# Patient Record
Sex: Female | Born: 1955 | Race: White | Hispanic: No | Marital: Married | Smoking: Former smoker
Health system: Southern US, Community
[De-identification: ages and names within clinical notes are randomized; demographics above are authoritative.]

## PROBLEM LIST (undated history)

## (undated) DIAGNOSIS — M810 Age-related osteoporosis without current pathological fracture: Secondary | ICD-10-CM

## (undated) DIAGNOSIS — I4729 Other ventricular tachycardia: Secondary | ICD-10-CM

## (undated) DIAGNOSIS — F329 Major depressive disorder, single episode, unspecified: Secondary | ICD-10-CM

## (undated) DIAGNOSIS — M1990 Unspecified osteoarthritis, unspecified site: Secondary | ICD-10-CM

## (undated) DIAGNOSIS — F419 Anxiety disorder, unspecified: Secondary | ICD-10-CM

## (undated) DIAGNOSIS — I472 Ventricular tachycardia: Secondary | ICD-10-CM

## (undated) DIAGNOSIS — F32A Depression, unspecified: Secondary | ICD-10-CM

## (undated) HISTORY — PX: TUBAL LIGATION: SHX77

## (undated) HISTORY — DX: Other ventricular tachycardia: I47.29

## (undated) HISTORY — PX: LEG SURGERY: SHX1003

## (undated) HISTORY — PX: WISDOM TOOTH EXTRACTION: SHX21

## (undated) HISTORY — DX: Ventricular tachycardia: I47.2

## (undated) HISTORY — PX: OTHER SURGICAL HISTORY: SHX169

## (undated) HISTORY — PX: ABDOMINAL HYSTERECTOMY: SHX81

---

## 1997-11-08 ENCOUNTER — Encounter: Admission: RE | Admit: 1997-11-08 | Discharge: 1997-11-08 | Payer: Self-pay | Admitting: Family Medicine

## 1997-11-08 ENCOUNTER — Other Ambulatory Visit: Admission: RE | Admit: 1997-11-08 | Discharge: 1997-11-08 | Payer: Self-pay | Admitting: *Deleted

## 1999-04-10 ENCOUNTER — Encounter: Admission: RE | Admit: 1999-04-10 | Discharge: 1999-04-10 | Payer: Self-pay | Admitting: Family Medicine

## 2003-01-17 ENCOUNTER — Ambulatory Visit (HOSPITAL_COMMUNITY): Admission: RE | Admit: 2003-01-17 | Discharge: 2003-01-17 | Payer: Self-pay | Admitting: Family Medicine

## 2003-01-27 ENCOUNTER — Encounter: Admission: RE | Admit: 2003-01-27 | Discharge: 2003-01-27 | Payer: Self-pay | Admitting: Family Medicine

## 2008-03-05 ENCOUNTER — Inpatient Hospital Stay (HOSPITAL_COMMUNITY): Admission: AD | Admit: 2008-03-05 | Discharge: 2008-03-05 | Payer: Self-pay | Admitting: Family Medicine

## 2009-05-22 ENCOUNTER — Inpatient Hospital Stay (HOSPITAL_COMMUNITY): Admission: EM | Admit: 2009-05-22 | Discharge: 2009-05-26 | Payer: Self-pay | Admitting: Emergency Medicine

## 2009-06-05 ENCOUNTER — Encounter: Admission: RE | Admit: 2009-06-05 | Discharge: 2009-09-12 | Payer: Self-pay | Admitting: Orthopedic Surgery

## 2010-03-23 ENCOUNTER — Encounter: Payer: Self-pay | Admitting: Family Medicine

## 2010-05-27 LAB — BASIC METABOLIC PANEL
BUN: 7 mg/dL (ref 6–23)
BUN: 8 mg/dL (ref 6–23)
CO2: 27 mEq/L (ref 19–32)
Calcium: 8 mg/dL — ABNORMAL LOW (ref 8.4–10.5)
Calcium: 8.2 mg/dL — ABNORMAL LOW (ref 8.4–10.5)
Calcium: 8.8 mg/dL (ref 8.4–10.5)
Chloride: 105 mEq/L (ref 96–112)
Chloride: 108 mEq/L (ref 96–112)
Creatinine, Ser: 0.69 mg/dL (ref 0.4–1.2)
Creatinine, Ser: 0.72 mg/dL (ref 0.4–1.2)
GFR calc Af Amer: >60 mL/min (ref >60)
GFR calc Af Amer: >60 mL/min (ref >60)
GFR calc Af Amer: >60 mL/min (ref >60)
GFR calc non Af Amer: >60 mL/min (ref >60)
GFR calc non Af Amer: >60 mL/min (ref >60)
GFR calc non Af Amer: >60 mL/min (ref >60)
Glucose, Bld: 127 mg/dL — ABNORMAL HIGH (ref 70–99)
Glucose, Bld: 85 mg/dL (ref 70–99)
Potassium: 3.4 mEq/L — ABNORMAL LOW (ref 3.5–5.1)
Potassium: 3.5 mEq/L (ref 3.5–5.1)
Potassium: 3.6 mEq/L (ref 3.5–5.1)
Sodium: 135 mEq/L (ref 135–145)
Sodium: 136 mEq/L (ref 135–145)
Sodium: 138 mEq/L (ref 135–145)
Sodium: 141 mEq/L (ref 135–145)

## 2010-05-27 LAB — COMPREHENSIVE METABOLIC PANEL
ALT: 15 U/L (ref 0–35)
AST: 21 U/L (ref 0–37)
Albumin: 3.2 g/dL — ABNORMAL LOW (ref 3.5–5.2)
Alkaline Phosphatase: 84 U/L (ref 39–117)
BUN: 6 mg/dL (ref 6–23)
CO2: 25 mEq/L (ref 19–32)
Calcium: 8.7 mg/dL (ref 8.4–10.5)
Chloride: 109 mEq/L (ref 96–112)
Creatinine, Ser: 0.79 mg/dL (ref 0.4–1.2)
GFR calc Af Amer: >60 mL/min (ref >60)
GFR calc non Af Amer: >60 mL/min (ref >60)
Glucose, Bld: 112 mg/dL — ABNORMAL HIGH (ref 70–99)
Potassium: 3.8 mEq/L (ref 3.5–5.1)
Sodium: 140 mEq/L (ref 135–145)
Total Bilirubin: 0.6 mg/dL (ref 0.3–1.2)
Total Protein: 6.4 g/dL (ref 6.0–8.3)

## 2010-05-27 LAB — DIFFERENTIAL
Basophils Absolute: 0 10*3/uL (ref 0.0–0.1)
Basophils Absolute: 0 10*3/uL (ref 0.0–0.1)
Basophils Relative: 0 % (ref 0–1)
Basophils Relative: 0 % (ref 0–1)
Eosinophils Absolute: 0 10*3/uL (ref 0.0–0.7)
Eosinophils Absolute: 0.1 10*3/uL (ref 0.0–0.7)
Eosinophils Relative: 0 % (ref 0–5)
Eosinophils Relative: 1 % (ref 0–5)
Lymphocytes Relative: 13 % (ref 12–46)
Lymphocytes Relative: 13 % (ref 12–46)
Lymphs Abs: 1 10*3/uL (ref 0.7–4.0)
Lymphs Abs: 1.1 10*3/uL (ref 0.7–4.0)
Monocytes Absolute: 0.3 10*3/uL (ref 0.1–1.0)
Monocytes Absolute: 0.7 10*3/uL (ref 0.1–1.0)
Monocytes Relative: 3 % (ref 3–12)
Monocytes Relative: 8 % (ref 3–12)
Neutro Abs: 6.6 10*3/uL (ref 1.7–7.7)
Neutro Abs: 6.7 10*3/uL (ref 1.7–7.7)
Neutrophils Relative %: 79 % — ABNORMAL HIGH (ref 43–77)
Neutrophils Relative %: 83 % — ABNORMAL HIGH (ref 43–77)

## 2010-05-27 LAB — CBC
HCT: 28.4 % — ABNORMAL LOW (ref 36.0–46.0)
HCT: 33.8 % — ABNORMAL LOW (ref 36.0–46.0)
HCT: 34.2 % — ABNORMAL LOW (ref 36.0–46.0)
HCT: 37.8 % (ref 36.0–46.0)
Hemoglobin: 11.8 g/dL — ABNORMAL LOW (ref 12.0–15.0)
Hemoglobin: 12.9 g/dL (ref 12.0–15.0)
Hemoglobin: 9.8 g/dL — ABNORMAL LOW (ref 12.0–15.0)
MCHC: 34.3 g/dL (ref 30.0–36.0)
MCHC: 34.5 g/dL (ref 30.0–36.0)
MCV: 81 fL (ref 78.0–100.0)
MCV: 81.3 fL (ref 78.0–100.0)
MCV: 81.9 fL (ref 78.0–100.0)
Platelets: 193 10*3/uL (ref 150–400)
Platelets: 223 10*3/uL (ref 150–400)
Platelets: 230 10*3/uL (ref 150–400)
RBC: 4.17 MIL/uL (ref 3.87–5.11)
RBC: 4.18 MIL/uL (ref 3.87–5.11)
RBC: 4.64 MIL/uL (ref 3.87–5.11)
RDW: 14.2 % (ref 11.5–15.5)
RDW: 14.5 % (ref 11.5–15.5)
RDW: 14.7 % (ref 11.5–15.5)
WBC: 6.6 10*3/uL (ref 4.0–10.5)
WBC: 8.1 10*3/uL (ref 4.0–10.5)
WBC: 8.3 10*3/uL (ref 4.0–10.5)
WBC: 8.3 10*3/uL (ref 4.0–10.5)

## 2010-05-27 LAB — URINE CULTURE: Colony Count: >=100000

## 2010-05-27 LAB — URINALYSIS, MICROSCOPIC ONLY
Ketones, ur: >80 mg/dL — AB
Nitrite: NEGATIVE
Specific Gravity, Urine: 1.011 (ref 1.005–1.030)
pH: 6.5 (ref 5.0–8.0)

## 2010-05-27 LAB — PROTIME-INR
INR: 1.01 (ref 0.00–1.49)
INR: 1.09 (ref 0.00–1.49)
Prothrombin Time: 13.2 seconds (ref 11.6–15.2)
Prothrombin Time: 14 seconds (ref 11.6–15.2)

## 2010-05-27 LAB — PREALBUMIN: Prealbumin: 11.7 mg/dL — ABNORMAL LOW (ref 18.0–45.0)

## 2010-05-27 LAB — VITAMIN D 1,25 DIHYDROXY: Vitamin D3 1, 25 (OH)2: 37 pg/mL

## 2010-05-27 LAB — APTT: aPTT: 36 seconds (ref 24–37)

## 2010-05-27 LAB — ALBUMIN: Albumin: 3.2 g/dL — ABNORMAL LOW (ref 3.5–5.2)

## 2010-06-17 LAB — WET PREP, GENITAL: Trich, Wet Prep: NONE SEEN

## 2010-06-17 LAB — CBC
HCT: 33.4 % — ABNORMAL LOW (ref 36.0–46.0)
Hemoglobin: 11.4 g/dL — ABNORMAL LOW (ref 12.0–15.0)
MCHC: 34.1 g/dL (ref 30.0–36.0)
RBC: 4.09 MIL/uL (ref 3.87–5.11)
RDW: 14.8 % (ref 11.5–15.5)

## 2010-06-17 LAB — GC/CHLAMYDIA PROBE AMP, GENITAL
Chlamydia, DNA Probe: NEGATIVE
GC Probe Amp, Genital: NEGATIVE

## 2011-01-19 IMAGING — CR DG CHEST 2V
1 series · 1 of 1 positions shown · non-contrast
Comparison: 05/22/2009

CLINICAL DATA: Fever.  Postop tibial fracture.

CHEST - 2 VIEW

[w chest lat]
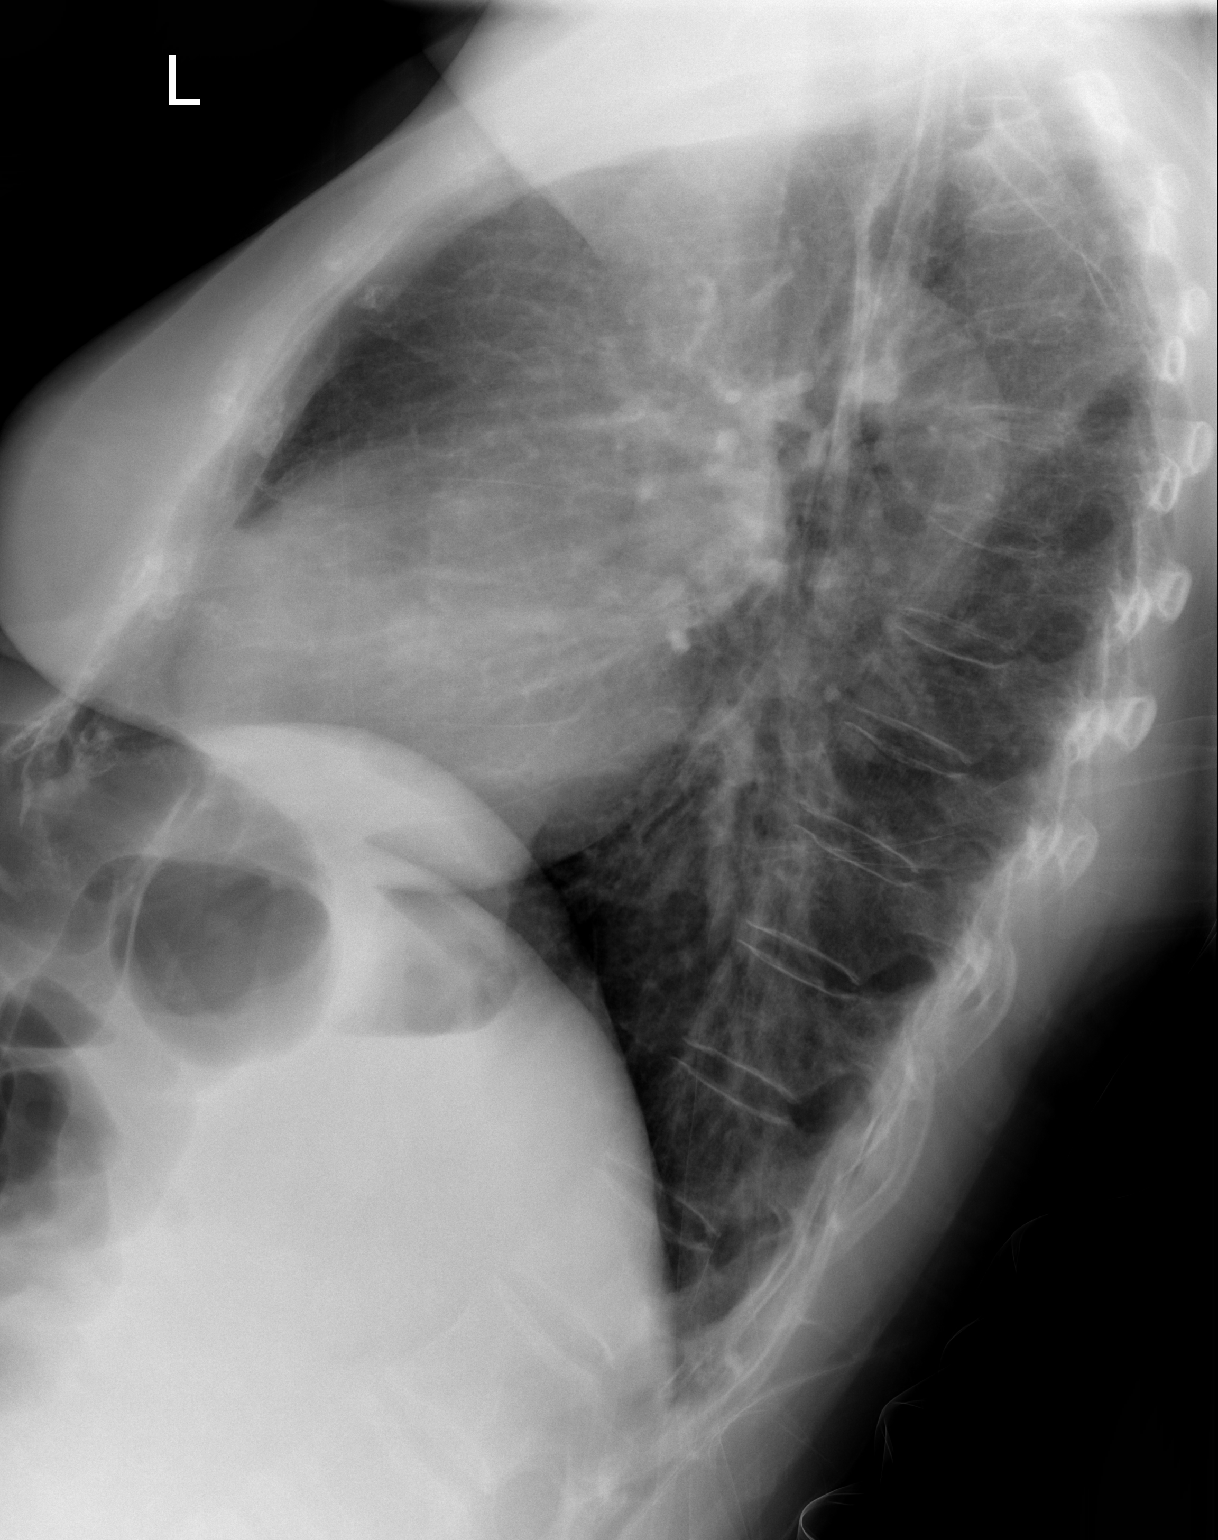

[1 of 1 positions shown; findings below may reference images not displayed]

FINDINGS: Heart size upper normal.  There is no heart failure.  The
lungs are clear without infiltrate or effusion.  No interval
change.
IMPRESSION: No acute cardiopulmonary disease.

## 2011-10-06 ENCOUNTER — Emergency Department (HOSPITAL_COMMUNITY)
Admission: EM | Admit: 2011-10-06 | Discharge: 2011-10-06 | Disposition: A | Discharge status: Home / Self Care | Payer: Self-pay | Attending: Emergency Medicine | Admitting: Emergency Medicine

## 2011-10-06 ENCOUNTER — Emergency Department (HOSPITAL_COMMUNITY): Payer: Self-pay

## 2011-10-06 ENCOUNTER — Encounter (HOSPITAL_COMMUNITY): Payer: Self-pay

## 2011-10-06 DIAGNOSIS — K224 Dyskinesia of esophagus: Secondary | ICD-10-CM

## 2011-10-06 DIAGNOSIS — R079 Chest pain, unspecified: Secondary | ICD-10-CM | POA: Insufficient documentation

## 2011-10-06 DIAGNOSIS — R0602 Shortness of breath: Secondary | ICD-10-CM | POA: Insufficient documentation

## 2011-10-06 LAB — COMPREHENSIVE METABOLIC PANEL
ALT: 14 U/L (ref 0–35)
AST: 22 U/L (ref 0–37)
Albumin: 3.8 g/dL (ref 3.5–5.2)
Alkaline Phosphatase: 112 U/L (ref 39–117)
BUN: 10 mg/dL (ref 6–23)
Chloride: 105 mEq/L (ref 96–112)
Potassium: 4.1 mEq/L (ref 3.5–5.1)
Sodium: 140 mEq/L (ref 135–145)
Total Bilirubin: 0.5 mg/dL (ref 0.3–1.2)
Total Protein: 7.3 g/dL (ref 6.0–8.3)

## 2011-10-06 LAB — CBC WITH DIFFERENTIAL/PLATELET
Basophils Absolute: 0 10*3/uL (ref 0.0–0.1)
Basophils Relative: 0 % (ref 0–1)
Eosinophils Absolute: 0.2 10*3/uL (ref 0.0–0.7)
Eosinophils Relative: 3 % (ref 0–5)
HCT: 39.9 % (ref 36.0–46.0)
Hemoglobin: 13.8 g/dL (ref 12.0–15.0)
Lymphocytes Relative: 38 % (ref 12–46)
Lymphs Abs: 1.9 K/uL (ref 0.7–4.0)
MCH: 27 pg (ref 26.0–34.0)
MCHC: 34.6 g/dL (ref 30.0–36.0)
MCV: 78.1 fL (ref 78.0–100.0)
Monocytes Absolute: 0.3 K/uL (ref 0.1–1.0)
Monocytes Relative: 6 % (ref 3–12)
Neutro Abs: 2.6 10*3/uL (ref 1.7–7.7)
Neutrophils Relative %: 53 % (ref 43–77)
Platelets: 213 10*3/uL (ref 150–400)
RBC: 5.11 MIL/uL (ref 3.87–5.11)
RDW: 14.3 % (ref 11.5–15.5)
WBC: 5 K/uL (ref 4.0–10.5)

## 2011-10-06 LAB — COMPREHENSIVE METABOLIC PANEL WITH GFR
CO2: 24 meq/L (ref 19–32)
Calcium: 9.4 mg/dL (ref 8.4–10.5)
Creatinine, Ser: 0.62 mg/dL (ref 0.50–1.10)
GFR calc Af Amer: >90 mL/min (ref >90)
GFR calc non Af Amer: >90 mL/min (ref >90)
Glucose, Bld: 106 mg/dL — ABNORMAL HIGH (ref 70–99)

## 2011-10-06 LAB — POCT I-STAT TROPONIN I: Troponin i, poc: 0 ng/mL (ref 0.00–0.08)

## 2011-10-06 MED ORDER — FAMOTIDINE 20 MG PO TABS: 20.0000 mg | ORAL_TABLET | Freq: Every evening | ORAL | Status: DC | PRN

## 2011-10-06 MED ORDER — GI COCKTAIL ~~LOC~~
30.0000 mL | Freq: Once | ORAL | Status: AC
  Administered 2011-10-06: 30 mL via ORAL
  Filled 2011-10-06: qty 30

## 2011-10-06 MED ORDER — ONDANSETRON 4 MG PO TBDP
8.0000 mg | ORAL_TABLET | Freq: Once | ORAL | Status: AC
  Administered 2011-10-06: 8 mg via ORAL
  Filled 2011-10-06: qty 2

## 2011-10-06 NOTE — ED Notes (Signed)
Pt states that she woke up with a pain in her left side of chest. Pt states that the pain went through to her back and to her left shoulder. Pt states that during the pain she felt like her lips were tingling and her left hand was tingling.pt also states that her feet will sometimes be tingling too. Pt states she has a hx of acid reflux, but denies any tonight. Pt denies SOB. Pt slightly nauseated.

## 2011-10-06 NOTE — Discharge Instructions (Signed)
Esophageal Spasm  Esophageal spasm is an uncoordinated contraction of the muscles of the esophagus (the tube which carries food from your mouth to your stomach). Normally, the muscles of the esophagus alternate between contraction and relaxation starting from the top of the esophagus and working down to the bottom. This moves the food from the mouth to the stomach. In esophageal spasm, all the muscles contract at once. This causes pain and fails to move the food along. As a result, you may have trouble swallowing.   Women are more likely than men to have esophageal spasm. The cause of the spasms is not known. Sometimes eating hot or cold foods triggers the condition and this may be due to an overly sensitive esophagus. This is not an infectious disease and cannot be passed to others.  SYMPTOMS   Symptoms of esophageal spasm may include: chest pain, burning or pain with swallowing, and difficulty swallowing.   DIAGNOSIS   Esophageal spasm can be diagnosed by a test called manometry (pressure studies of the esophagus). In this test, a special tube is inserted down the esophagus. The tube measures the muscle activity of the esophagus. Abnormal contractions mixed with normal movement helps confirm the diagnosis.   A person with a hypersensitive esophagus may be diagnosed by inflating a long balloon in the person's esophagus. If this causes the same symptoms, preventive methods may work.  PREVENTION   Avoid hot or cold foods if that seems to be a trigger.  PROGNOSIS   This condition does not go away, nor is treatment entirely satisfactory. Patients need to be careful of what they eat. They need to continue on medication if a useful one is found. Fortunately, the condition does not get progressively worse as time passes.  Esophageal spasm does not usually lead to more serious problems but sometimes the pain can be disabling. If a person becomes afraid to eat they may become malnourished and lose weight.   TREATMENT    A  procedure in which instruments of increasing size are inserted through the esophagus to enlarge (dilate) it are used.   Medications that decrease acid-production of the stomach may be used such as proton-pump inhibitors or H2-blockers.   Medications of several types can be used to relax the muscles of the esophagus.   An individual with a hypersensitive esophagus sometimes improves with low doses of medications normally used for depression.   No treatment for esophageal spasm is effective for everyone. Often several approaches will be tried before one works. In many cases, the symptoms will improve, but will not go away completely.   For severe cases, relief is obtained two-thirds of the time by cutting the muscles along the entire length of the esophagus. This is a major surgical procedure.   Your symptoms are usually the best guide to how well the treatment for esophageal spasm works.  SIDE EFFECTS OF TREATMENTS   Nitrates can cause headaches and low blood pressure.   Calcium channel blockers can cause:   Feeling sick to your stomach (nausea).   Constipation and other side effects.   Antidepressants can cause side effects that depend on the medication used.  HOME CARE INSTRUCTIONS    Let your caregiver know if problems are getting worse, or if you get food stuck in your esophagus for longer than 1 hour or as directed and are unable to swallow liquid.   Take medications as directed and with permission of your caregiver. Ask about what to do if a   medication seems to get stuck in your esophagus. Only take over-the-counter or prescription medicines for pain, discomfort, or fever as directed by your caregiver.   Soft and liquid foods pass more easily than solid pieces.  SEEK IMMEDIATE MEDICAL CARE IF:    You develop severe chest pain, especially if the pain is crushing or pressure-like and spreads to the arms, back, neck, or jaw, or if you have sweating, nausea, or shortness of breath. THIS COULD BE AN  EMERGENCY. Do not wait to see if the pain will go away. Get medical help at once. Call 911 or 0 (operator). DO NOT drive yourself to the hospital.   Your chest pain gets worse and does not go away with rest.   You have an attack of chest pain lasting longer than usual despite rest and treatment with the medications your physician has prescribed.   You wake from sleep with chest pain or shortness of breath.   You feel dizzy or faint.   You have chest pain, not typical of your usual pain, caused by your esophagus for which you originally saw your caregiver.  MAKE SURE YOU:    Understand these instructions.   Will watch your condition.   Will get help right away if you are not doing well or get worse.  Document Released: 05/10/2002 Document Revised: 02/06/2011 Document Reviewed: 11/23/2007  [employer] Patient Information 2012 [employer].

## 2011-10-06 NOTE — ED Notes (Addendum)
Pt. Reports "being woken from her sleep by left sided chest pain approx ago". Radiates to left arm, and left shoulder. Reports nausea. No vomiting. Reports numbness and tingling in left leg.  No facial droop noted. Hand grips equal. Foot presses equal.

## 2011-10-06 NOTE — ED Provider Notes (Signed)
History     CSN: [id number]  Arrival date & time 10/06/11  0225   First MD Initiated Contact with Patient 10/06/11 [phone]      Chief Complaint  Patient presents with  . Chest Pain    (Consider location/radiation/quality/duration/timing/severity/associated sxs/prior treatment) HPI Comments: Patient was in her usual state of health, had eaten at a restaurant with her husband at a normal time earlier last night and was woken at about 1:30 this morning with sudden onset of chest pressure that radiated into her left upper back. She reports she felt slightly clammy, did get nauseated without vomiting and felt slightly short of breath. She reports that she did have some tingling around her lips and also down her left arm and into her fingertips. She reports the pain has eased a little bit since it began but has not resolved. She reports she has no prior history of cardiac disease, does not smoke, does not have a history of hypertension, hyperlipidemia or diabetes. There is no significant family history of cardiac disease. She reports that she does get heartburn on occasion and will take TUMS but did not take any today. She reports no recent fever, cough or cold symptoms. She reports no abdominal pain, flank pain, dysuria, diarrhea.  Patient is a 56 y.o. female presenting with chest pain. The history is provided by the patient.  Chest Pain Primary symptoms include shortness of breath and nausea. Pertinent negatives for primary symptoms include no cough, no palpitations, no abdominal pain and no vomiting.     History reviewed. No pertinent past medical history.  Past Surgical History  Procedure Date  . Right leg surgery     History reviewed. No pertinent family history.  History  Substance Use Topics  . Smoking status: Never Smoker   . Smokeless tobacco: Not on file  . Alcohol Use: No    OB History    Grav Para Term Preterm Abortions TAB SAB Ect Mult Living                  Review of  Systems  Constitutional: Negative.   HENT: Negative for congestion and rhinorrhea.   Respiratory: Positive for shortness of breath. Negative for cough.   Cardiovascular: Positive for chest pain. Negative for palpitations and leg swelling.  Gastrointestinal: Positive for nausea. Negative for vomiting, abdominal pain, diarrhea and constipation.  Musculoskeletal: Positive for back pain.  Skin: Negative for rash.  All other systems reviewed and are negative.    Allergies  Penicillins  Home Medications   Current Outpatient Rx  Name Route Sig Dispense Refill  . CALCIUM CARBONATE ANTACID 500 MG PO CHEW Oral Chew 1 tablet by mouth daily as needed. For indigestion    . IBUPROFEN 200 MG PO TABS Oral Take 200 mg by mouth every 6 (six) hours as needed. For pain      BP 172/104  Pulse 84  Temp 98 F (36.7 C) (Oral)  Resp 20  SpO2 96%  Physical Exam  Nursing note and vitals reviewed. Constitutional: She is oriented to person, place, and time. She appears well-developed and well-nourished. No distress.  HENT:  Head: Normocephalic and atraumatic.  Eyes: Conjunctivae are normal. Pupils are equal, round, and reactive to light. No scleral icterus.  Neck: Normal range of motion. Neck supple.  Cardiovascular: Normal rate, regular rhythm, normal heart sounds and intact distal pulses.   No extrasystoles are present.  No murmur heard. Pulmonary/Chest: Effort normal. No respiratory distress. She has no wheezes.  She has no rales.  Abdominal: Soft. She exhibits no distension. There is no tenderness. There is no rebound and no guarding.  Musculoskeletal: Normal range of motion. She exhibits no edema and no tenderness.  Neurological: She is alert and oriented to person, place, and time. She has normal strength. She exhibits normal muscle tone. Coordination and gait normal.  Skin: Skin is warm and dry. No rash noted.    ED Course  Procedures (including critical care time)  Labs Reviewed    COMPREHENSIVE METABOLIC PANEL - Abnormal; Notable for the following:    Glucose, Bld 106 (*)     All other components within normal limits  CBC WITH DIFFERENTIAL  POCT I-STAT TROPONIN I   Dg Chest 2 View  10/06/2011  *RADIOLOGY REPORT*  Clinical Data: Mid chest pain and nausea.  CHEST - 2 VIEW  Comparison: 05/25/2009  Findings: The heart size and pulmonary vascularity are normal. The lungs appear clear and expanded without focal air space disease or consolidation. No blunting of the costophrenic angles.  Mild interstitial fibrosis may represent chronic bronchitic changes.  No pneumothorax.  No significant change since previous study.  IMPRESSION: No evidence of active pulmonary disease.  Original Report Authenticated By: Marlon Pel, M.D.     1. Esophageal spasm     EKG at time of 02:27, shows normal sinus rhythm at a rate of 81, normal axis, normal intervals, no ST or T-wave abnormalities. Improvement in nonspecific T wave flattening seen on 05/23/2009.  Room air saturation 96% I interpret to be normal.  4:22 AM After Zofran and GI cocktail, pain resolved.  I suspect pt had esophageal spasm and then due to severity of pain had hyperventilation making it worse.  Pt is now calm, troponin is normal, eCG normal, will d/c home.    MDM   Patient with onset of left-sided chest pressure radiating to her back that woke her suddenly from sleep. Her EKG shows no ischemic changes. Two-view chest film shows no widened mediastinum, no pulmonary disease. Patient does have a history of reflux on occasion, the take any Tums this morning. I suspect the patient may have had acid reflux, esophageal spasm, or other similar upper GI pathology. Her abdomen is soft and there is no rebound or guarding therefore do not feel that she had perforated ulcer or any other surgical complication. She has no significant risk factors for cardiac disease with a TIMI score of 0.        Gavin Pound. Oletta Lamas,  MD 10/06/11 4098

## 2011-10-06 NOTE — ED Notes (Addendum)
EDP at bedside. Pt given sprite.

## 2013-12-07 ENCOUNTER — Other Ambulatory Visit: Payer: Self-pay | Admitting: Family Medicine

## 2013-12-07 DIAGNOSIS — M545 Low back pain, unspecified: Secondary | ICD-10-CM

## 2013-12-07 DIAGNOSIS — R1310 Dysphagia, unspecified: Secondary | ICD-10-CM

## 2013-12-12 ENCOUNTER — Ambulatory Visit
Admission: RE | Admit: 2013-12-12 | Discharge: 2013-12-12 | Disposition: A | Discharge status: Home / Self Care | Payer: BC Managed Care – PPO | Source: Ambulatory Visit | Attending: Family Medicine | Admitting: Family Medicine

## 2013-12-12 ENCOUNTER — Encounter (INDEPENDENT_AMBULATORY_CARE_PROVIDER_SITE_OTHER): Payer: Self-pay

## 2013-12-12 DIAGNOSIS — R1310 Dysphagia, unspecified: Secondary | ICD-10-CM

## 2013-12-15 ENCOUNTER — Other Ambulatory Visit: Payer: Self-pay

## 2014-05-03 ENCOUNTER — Other Ambulatory Visit: Payer: Self-pay | Admitting: Obstetrics and Gynecology

## 2014-05-05 ENCOUNTER — Other Ambulatory Visit (HOSPITAL_COMMUNITY): Payer: Self-pay | Admitting: Obstetrics and Gynecology

## 2014-05-05 DIAGNOSIS — Z1231 Encounter for screening mammogram for malignant neoplasm of breast: Secondary | ICD-10-CM

## 2014-05-12 ENCOUNTER — Other Ambulatory Visit: Payer: Self-pay

## 2014-05-12 ENCOUNTER — Encounter (HOSPITAL_COMMUNITY)
Admission: RE | Admit: 2014-05-12 | Discharge: 2014-05-12 | Disposition: A | Discharge status: Home / Self Care | Payer: BLUE CROSS/BLUE SHIELD | Source: Ambulatory Visit | Attending: Obstetrics and Gynecology | Admitting: Obstetrics and Gynecology

## 2014-05-12 ENCOUNTER — Encounter (HOSPITAL_COMMUNITY): Payer: Self-pay

## 2014-05-12 ENCOUNTER — Ambulatory Visit (HOSPITAL_COMMUNITY)
Admission: RE | Admit: 2014-05-12 | Discharge: 2014-05-12 | Disposition: A | Discharge status: Home / Self Care | Payer: BLUE CROSS/BLUE SHIELD | Source: Ambulatory Visit | Attending: Obstetrics and Gynecology | Admitting: Obstetrics and Gynecology

## 2014-05-12 DIAGNOSIS — Z1231 Encounter for screening mammogram for malignant neoplasm of breast: Secondary | ICD-10-CM | POA: Diagnosis present

## 2014-05-12 LAB — CBC
HEMATOCRIT: 38.7 % (ref 36.0–46.0)
Hemoglobin: 12.4 g/dL (ref 12.0–15.0)
MCH: 25.6 pg — AB (ref 26.0–34.0)
MCHC: 32 g/dL (ref 30.0–36.0)
MCV: 79.8 fL (ref 78.0–100.0)
Platelets: 202 10*3/uL (ref 150–400)
RBC: 4.85 MIL/uL (ref 3.87–5.11)
RDW: 14.2 % (ref 11.5–15.5)
WBC: 3.5 10*3/uL — AB (ref 4.0–10.5)

## 2014-05-12 NOTE — Patient Instructions (Signed)
   Your procedure is scheduled on: MARCH 15 AT 730AM  Enter through the [facility] at: 6AM  Pick up the phone at the desk and dial [phone] and inform us of your arrival.  Please call this number if you have any problems the morning of surgery: [phone]  Remember: Do not eat food or drink clear liquids  after midnight: MARCH 14 : Take these medicines the morning of surgery with a SIP OF WATER:  Do not wear jewelry, make-up, or FINGER nail polish No metal in your hair or on your body. Do not wear lotions, powders, perfumes.  You may wear deodorant.  Do not bring valuables to the hospital. Contacts, dentures or bridgework may not be worn into surgery.  Leave suitcase in the car. After Surgery it may be brought to your room. For patients being admitted to the hospital, checkout time is 11:00am the day of discharge.

## 2014-05-15 MED ORDER — GENTAMICIN SULFATE 40 MG/ML IJ SOLN
INTRAVENOUS | Status: AC
  Administered 2014-05-16: 300 mL via INTRAVENOUS
  Filled 2014-05-15: qty 7.5

## 2014-05-15 NOTE — H&P (Signed)
Admission History and Physical Exam for a Gynecology Patient  [name] is a 59 y.o. female, P 3-0-0-3,  who presents for a vaginal hysterectomy, anterior and posterior colporrhaphy, and a Burch cystourethropexy. [facility]. The patient has had pelvic pressure and a sensation that something was falling out of her vagina.  The patient also complains of stress urinary incontinence.  She has had 3 children.  An ultrasound showed a normal uterus and ovaries.  She denies a history of abnormal Pap smears.  Her most recent mammogram was within normal limits.  She wants to proceed with surgical repair.  She is status post tubal sterilization procedure.  OB History    The patient has had 3 vaginal deliveries.      Past medical history: The patient denies hypertension and diabetes.  No prescriptions prior to admission    Past Surgical History  Procedure Laterality Date  . Right leg surgery    . Leg surgery      metal plate with screws  . Tubal ligation      Allergies  Allergen Reactions  . Oxycodone Rash    Patient is allergic to narcotic pain medication.  Marland Kitchen. Penicillins Rash    Family History:  The patient's father and sister had lung cancer.  Another sister had pancreatic cancer.  Her mother had a stroke.  Social History:  reports that she has never smoked. She does not have any smokeless tobacco history on file. She reports that she does not drink alcohol or use illicit drugs.  Review of systems: See HPI.  Admission Physical Exam:    BMI equals 27.  HEENT:                 Within normal limits Chest:                   Clear Heart:                    Regular rate and rhythm Breasts:                No masses, skin changes, bleeding, or discharge present Abdomen:             Nontender, subcutaneous mass present on the lower abdomen Extremities:          Grossly  normal Neurologic exam: Grossly normal  Pelvic exam:  External genitalia: normal general appearance Vaginal: atrophic mucosa and cystocele and rectocele present.  The cystocele prolapses to, but not beyond, the introitus.  There is loss of the urethrovesical angle. No enterocele appreciated. Cervix: normal appearance and the cervix prolapses proximally one half the length of the vagina.  It is not visible at the introitus. Adnexa: normal bimanual exam Uterus: small, normal contour, no masses Rectal: no masses  Assessment:  Stage I pelvic organ prolapse.  Cystocele and rectocele present.  Stress urinary incontinence.  Lipomas on the lower abdomen.  Penicillin allergy.  Plan:  The patient will undergo a vaginal hysterectomy with anterior and posterior colporrhaphy.  She has elected to have a Burch cystourethropexy.  We discussed our surgical treatment options.  The patient has researched the use of mesh to correct pelvic organ prolapse.  She reports that she does not want mesh of any kind to be used.  The patient understands the indications for her surgical procedure.  She accepts the risk of, but  not limited to, anesthetic complications, bleeding, infections, possible damage to surrounding organs, and possible urinary retention that may require catheterization.   Janine Limbo 05/15/2014

## 2014-05-16 ENCOUNTER — Observation Stay (HOSPITAL_COMMUNITY)
Admission: RE | Admit: 2014-05-16 | Discharge: 2014-05-17 | Disposition: A | Discharge status: Home / Self Care | Payer: BLUE CROSS/BLUE SHIELD | Source: Ambulatory Visit | Attending: Obstetrics and Gynecology | Admitting: Obstetrics and Gynecology

## 2014-05-16 ENCOUNTER — Encounter (HOSPITAL_COMMUNITY): Payer: Self-pay | Admitting: Anesthesiology

## 2014-05-16 ENCOUNTER — Encounter (HOSPITAL_COMMUNITY)
Admission: RE | Disposition: A | Discharge status: Home / Self Care | Payer: Self-pay | Source: Ambulatory Visit | Attending: Obstetrics and Gynecology

## 2014-05-16 ENCOUNTER — Ambulatory Visit (HOSPITAL_COMMUNITY): Payer: BLUE CROSS/BLUE SHIELD | Admitting: Anesthesiology

## 2014-05-16 DIAGNOSIS — N819 Female genital prolapse, unspecified: Secondary | ICD-10-CM | POA: Diagnosis present

## 2014-05-16 DIAGNOSIS — Z880 Allergy status to penicillin: Secondary | ICD-10-CM | POA: Insufficient documentation

## 2014-05-16 DIAGNOSIS — N393 Stress incontinence (female) (male): Secondary | ICD-10-CM | POA: Diagnosis not present

## 2014-05-16 DIAGNOSIS — Z885 Allergy status to narcotic agent status: Secondary | ICD-10-CM | POA: Insufficient documentation

## 2014-05-16 DIAGNOSIS — L720 Epidermal cyst: Secondary | ICD-10-CM | POA: Diagnosis not present

## 2014-05-16 DIAGNOSIS — N814 Uterovaginal prolapse, unspecified: Secondary | ICD-10-CM | POA: Diagnosis present

## 2014-05-16 HISTORY — DX: Unspecified osteoarthritis, unspecified site: M19.90

## 2014-05-16 HISTORY — PX: VAGINAL HYSTERECTOMY: SHX2639

## 2014-05-16 HISTORY — PX: BURCH PROCEDURE: SHX1273

## 2014-05-16 HISTORY — PX: EAR CYST EXCISION: SHX22

## 2014-05-16 HISTORY — DX: Major depressive disorder, single episode, unspecified: F32.9

## 2014-05-16 HISTORY — PX: ANTERIOR AND POSTERIOR REPAIR: SHX5121

## 2014-05-16 HISTORY — DX: Anxiety disorder, unspecified: F41.9

## 2014-05-16 HISTORY — DX: Depression, unspecified: F32.A

## 2014-05-16 SURGERY — HYSTERECTOMY, VAGINAL
Anesthesia: General | Site: Vagina

## 2014-05-16 MED ORDER — EPHEDRINE 5 MG/ML INJ
INTRAVENOUS | Status: AC
  Filled 2014-05-16: qty 10

## 2014-05-16 MED ORDER — FENTANYL CITRATE 0.05 MG/ML IJ SOLN
INTRAMUSCULAR | Status: AC
  Filled 2014-05-16: qty 2

## 2014-05-16 MED ORDER — METOCLOPRAMIDE HCL 5 MG/ML IJ SOLN
INTRAMUSCULAR | Status: AC
  Filled 2014-05-16: qty 2

## 2014-05-16 MED ORDER — SODIUM CHLORIDE 0.9 % IJ SOLN
INTRAMUSCULAR | Status: AC
  Filled 2014-05-16: qty 30

## 2014-05-16 MED ORDER — PHENYLEPHRINE 40 MCG/ML (10ML) SYRINGE FOR IV PUSH (FOR BLOOD PRESSURE SUPPORT)
PREFILLED_SYRINGE | INTRAVENOUS | Status: AC
  Filled 2014-05-16: qty 10

## 2014-05-16 MED ORDER — KETOROLAC TROMETHAMINE 30 MG/ML IJ SOLN
INTRAMUSCULAR | Status: DC | PRN
  Administered 2014-05-16: 30 mg via INTRAVENOUS

## 2014-05-16 MED ORDER — GLYCOPYRROLATE 0.2 MG/ML IJ SOLN
INTRAMUSCULAR | Status: DC | PRN
  Administered 2014-05-16: 0.6 mg via INTRAVENOUS

## 2014-05-16 MED ORDER — FENTANYL CITRATE 0.05 MG/ML IJ SOLN
INTRAMUSCULAR | Status: DC | PRN
Start: 2014-05-16 — End: 2014-05-16
  Administered 2014-05-16 (×5): 50 ug via INTRAVENOUS
  Administered 2014-05-16: 100 ug via INTRAVENOUS
  Administered 2014-05-16: 25 ug via INTRAVENOUS

## 2014-05-16 MED ORDER — MENTHOL 3 MG MT LOZG: 1.0000 | LOZENGE | OROMUCOSAL | Status: DC | PRN

## 2014-05-16 MED ORDER — SODIUM CHLORIDE 0.9 % IJ SOLN
INTRAMUSCULAR | Status: DC | PRN
  Administered 2014-05-16: 20 mL

## 2014-05-16 MED ORDER — IBUPROFEN 600 MG PO TABS
600.0000 mg | ORAL_TABLET | Freq: Four times a day (QID) | ORAL | Status: DC
  Administered 2014-05-16 – 2014-05-17 (×4): 600 mg via ORAL
  Filled 2014-05-16 (×4): qty 1

## 2014-05-16 MED ORDER — FENTANYL CITRATE 0.05 MG/ML IJ SOLN
INTRAMUSCULAR | Status: AC
  Filled 2014-05-16: qty 5

## 2014-05-16 MED ORDER — ROCURONIUM BROMIDE 100 MG/10ML IV SOLN
INTRAVENOUS | Status: AC
  Filled 2014-05-16: qty 1

## 2014-05-16 MED ORDER — ONDANSETRON HCL 4 MG/2ML IJ SOLN
INTRAMUSCULAR | Status: DC | PRN
  Administered 2014-05-16: 4 mg via INTRAVENOUS

## 2014-05-16 MED ORDER — BUPIVACAINE HCL (PF) 0.5 % IJ SOLN
INTRAMUSCULAR | Status: AC
  Filled 2014-05-16: qty 30

## 2014-05-16 MED ORDER — BUPIVACAINE-EPINEPHRINE (PF) 0.5% -1:200000 IJ SOLN
INTRAMUSCULAR | Status: AC
  Filled 2014-05-16: qty 30

## 2014-05-16 MED ORDER — IBUPROFEN 600 MG PO TABS: 600.0000 mg | ORAL_TABLET | Freq: Four times a day (QID) | ORAL | Status: DC | PRN

## 2014-05-16 MED ORDER — SCOPOLAMINE 1 MG/3DAYS TD PT72
MEDICATED_PATCH | TRANSDERMAL | Status: AC
  Filled 2014-05-16: qty 1

## 2014-05-16 MED ORDER — DEXAMETHASONE SODIUM PHOSPHATE 4 MG/ML IJ SOLN
INTRAMUSCULAR | Status: AC
  Filled 2014-05-16: qty 1

## 2014-05-16 MED ORDER — BUPIVACAINE-EPINEPHRINE 0.5% -1:200000 IJ SOLN
INTRAMUSCULAR | Status: DC | PRN
  Administered 2014-05-16: 7 mL
  Administered 2014-05-16: 5 mL
  Administered 2014-05-16: 15 mL

## 2014-05-16 MED ORDER — PROPOFOL 10 MG/ML IV BOLUS
INTRAVENOUS | Status: AC
  Filled 2014-05-16: qty 20

## 2014-05-16 MED ORDER — LIDOCAINE HCL (CARDIAC) 20 MG/ML IV SOLN
INTRAVENOUS | Status: AC
  Filled 2014-05-16: qty 5

## 2014-05-16 MED ORDER — NEOSTIGMINE METHYLSULFATE 10 MG/10ML IV SOLN
INTRAVENOUS | Status: DC | PRN
  Administered 2014-05-16: 4 mg via INTRAVENOUS

## 2014-05-16 MED ORDER — METOCLOPRAMIDE HCL 5 MG/ML IJ SOLN
10.0000 mg | Freq: Once | INTRAMUSCULAR | Status: AC
  Administered 2014-05-16: 10 mg via INTRAVENOUS

## 2014-05-16 MED ORDER — ROCURONIUM BROMIDE 100 MG/10ML IV SOLN
INTRAVENOUS | Status: DC | PRN
  Administered 2014-05-16: 50 mg via INTRAVENOUS

## 2014-05-16 MED ORDER — ESTRADIOL 0.1 MG/GM VA CREA
TOPICAL_CREAM | VAGINAL | Status: DC | PRN
  Administered 2014-05-16: 1 via VAGINAL

## 2014-05-16 MED ORDER — FENTANYL CITRATE 0.05 MG/ML IJ SOLN: 25.0000 ug | INTRAMUSCULAR | Status: DC | PRN

## 2014-05-16 MED ORDER — PROPOFOL 10 MG/ML IV BOLUS
INTRAVENOUS | Status: DC | PRN
  Administered 2014-05-16: 160 mg via INTRAVENOUS

## 2014-05-16 MED ORDER — LIDOCAINE HCL (CARDIAC) 20 MG/ML IV SOLN
INTRAVENOUS | Status: DC | PRN
  Administered 2014-05-16: 20 mg via INTRAVENOUS
  Administered 2014-05-16: 80 mg via INTRAVENOUS

## 2014-05-16 MED ORDER — LACTATED RINGERS IV SOLN
INTRAVENOUS | Status: DC
  Administered 2014-05-16 – 2014-05-17 (×2): via INTRAVENOUS

## 2014-05-16 MED ORDER — BUPIVACAINE HCL (PF) 0.5 % IJ SOLN
INTRAMUSCULAR | Status: DC | PRN
  Administered 2014-05-16: 7 mL

## 2014-05-16 MED ORDER — EPHEDRINE SULFATE 50 MG/ML IJ SOLN
INTRAMUSCULAR | Status: DC | PRN
  Administered 2014-05-16: 5 mg via INTRAVENOUS
  Administered 2014-05-16: 10 mg via INTRAVENOUS
  Administered 2014-05-16 (×2): 5 mg via INTRAVENOUS

## 2014-05-16 MED ORDER — DEXAMETHASONE SODIUM PHOSPHATE 10 MG/ML IJ SOLN
INTRAMUSCULAR | Status: DC | PRN
  Administered 2014-05-16: 4 mg via INTRAVENOUS

## 2014-05-16 MED ORDER — LACTATED RINGERS IV SOLN
INTRAVENOUS | Status: DC
  Administered 2014-05-16 (×3): via INTRAVENOUS

## 2014-05-16 MED ORDER — ONDANSETRON HCL 4 MG PO TABS: 4.0000 mg | ORAL_TABLET | Freq: Three times a day (TID) | ORAL | Status: DC | PRN

## 2014-05-16 MED ORDER — HYDROMORPHONE HCL 2 MG PO TABS: 2.0000 mg | ORAL_TABLET | ORAL | Status: DC | PRN

## 2014-05-16 MED ORDER — ESTRADIOL 0.1 MG/GM VA CREA
TOPICAL_CREAM | VAGINAL | Status: AC
  Filled 2014-05-16: qty 42.5

## 2014-05-16 MED ORDER — FAMOTIDINE 20 MG PO TABS: 20.0000 mg | ORAL_TABLET | Freq: Every evening | ORAL | Status: DC | PRN

## 2014-05-16 MED ORDER — ONDANSETRON HCL 4 MG/2ML IJ SOLN
INTRAMUSCULAR | Status: AC
  Filled 2014-05-16: qty 2

## 2014-05-16 MED ORDER — SCOPOLAMINE 1 MG/3DAYS TD PT72
1.0000 | MEDICATED_PATCH | Freq: Once | TRANSDERMAL | Status: DC
  Administered 2014-05-16: 1.5 mg via TRANSDERMAL

## 2014-05-16 MED ORDER — STERILE WATER FOR IRRIGATION IR SOLN
Status: DC | PRN
  Administered 2014-05-16: 1000 mL via INTRAVESICAL

## 2014-05-16 MED ORDER — FENTANYL CITRATE 0.05 MG/ML IJ SOLN
INTRAMUSCULAR | Status: AC
Start: 2014-05-16 — End: 2014-05-16
  Filled 2014-05-16: qty 2

## 2014-05-16 MED ORDER — 0.9 % SODIUM CHLORIDE (POUR BTL) OPTIME
TOPICAL | Status: DC | PRN
  Administered 2014-05-16 (×2): 1000 mL

## 2014-05-16 MED ORDER — MIDAZOLAM HCL 2 MG/2ML IJ SOLN
INTRAMUSCULAR | Status: AC
  Filled 2014-05-16: qty 2

## 2014-05-16 MED ORDER — MIDAZOLAM HCL 2 MG/2ML IJ SOLN
INTRAMUSCULAR | Status: DC | PRN
  Administered 2014-05-16: 2 mg via INTRAVENOUS

## 2014-05-16 MED ORDER — BUPIVACAINE LIPOSOME 1.3 % IJ SUSP
20.0000 mL | Freq: Once | INTRAMUSCULAR | Status: AC
  Administered 2014-05-16: 20 mL
  Filled 2014-05-16: qty 20

## 2014-05-16 SURGICAL SUPPLY — 77 items
BLADE SURG 15 STRL LF C SS BP (BLADE) ×6 IMPLANT
BLADE SURG 15 STRL SS (BLADE) ×8
CANISTER SUCT 3000ML (MISCELLANEOUS) ×4 IMPLANT
CATH BONANNO SUPRAPUBIC 14G (CATHETERS) IMPLANT
CATH FOLEY 2WAY  5CC 16FR SIL (CATHETERS) ×2
CATH FOLEY 2WAY 5CC 16FR SIL (CATHETERS) ×2 IMPLANT
CATH FOLEY 3WAY 30CC 18FR (CATHETERS) ×4 IMPLANT
CHLORAPREP W/TINT 26ML (MISCELLANEOUS) ×2 IMPLANT
CLOTH BEACON ORANGE TIMEOUT ST (SAFETY) ×4 IMPLANT
CONT PATH 16OZ SNAP LID 3702 (MISCELLANEOUS) IMPLANT
COVER MAYO STAND STRL (DRAPES) ×2 IMPLANT
DECANTER SPIKE VIAL GLASS SM (MISCELLANEOUS) IMPLANT
DRAPE SHEET LG 3/4 BI-LAMINATE (DRAPES) ×8 IMPLANT
DRAPE UNDERBUTTOCKS STRL (DRAPE) ×4 IMPLANT
DRAPE WARM FLUID 44X44 (DRAPE) ×2 IMPLANT
DRSG OPSITE POSTOP 3X4 (GAUZE/BANDAGES/DRESSINGS) ×2 IMPLANT
DRSG TELFA 3X8 NADH (GAUZE/BANDAGES/DRESSINGS) ×4 IMPLANT
FORMULA 20CAL 3 OZ MEAD (FORMULA) IMPLANT
GAUZE PACKING 2X5 YD STRL (GAUZE/BANDAGES/DRESSINGS) ×2 IMPLANT
GAUZE SPONGE 4X4 16PLY XRAY LF (GAUZE/BANDAGES/DRESSINGS) ×7 IMPLANT
GLOVE BIO SURGEON STRL SZ 6.5 (GLOVE) ×1 IMPLANT
GLOVE BIO SURGEONS STRL SZ 6.5 (GLOVE) ×1
GLOVE BIOGEL PI IND STRL 6.5 (GLOVE) ×2 IMPLANT
GLOVE BIOGEL PI IND STRL 7.0 (GLOVE) IMPLANT
GLOVE BIOGEL PI IND STRL 8.5 (GLOVE) ×2 IMPLANT
GLOVE BIOGEL PI INDICATOR 6.5 (GLOVE) ×2
GLOVE BIOGEL PI INDICATOR 7.0 (GLOVE) ×20
GLOVE BIOGEL PI INDICATOR 8.5 (GLOVE) ×2
GLOVE ECLIPSE 8.0 STRL XLNG CF (GLOVE) ×8 IMPLANT
GLOVE SURG SS PI 7.0 STRL IVOR (GLOVE) ×12 IMPLANT
GOWN STRL REUS W/TWL LRG LVL3 (GOWN DISPOSABLE) ×28 IMPLANT
NDL FILTER BLUNT 18X1 1/2 (NEEDLE) ×1 IMPLANT
NDL HYPO 25X1 1.5 SAFETY (NEEDLE) IMPLANT
NDL SPNL 22GX3.5 QUINCKE BK (NEEDLE) IMPLANT
NEEDLE FILTER BLUNT 18X 1/2SAF (NEEDLE) ×2
NEEDLE FILTER BLUNT 18X1 1/2 (NEEDLE) ×2 IMPLANT
NEEDLE HYPO 22GX1.5 SAFETY (NEEDLE) ×2 IMPLANT
NEEDLE HYPO 25X1 1.5 SAFETY (NEEDLE) IMPLANT
NEEDLE MAYO .5 CIRCLE (NEEDLE) ×4 IMPLANT
NEEDLE SPNL 22GX3.5 QUINCKE BK (NEEDLE) ×4 IMPLANT
NS IRRIG 1000ML POUR BTL (IV SOLUTION) ×4 IMPLANT
PACK ABDOMINAL GYN (CUSTOM PROCEDURE TRAY) ×4 IMPLANT
PACK VAGINAL WOMENS (CUSTOM PROCEDURE TRAY) ×4 IMPLANT
PAD ABD 7.5X8 STRL (GAUZE/BANDAGES/DRESSINGS) ×2 IMPLANT
PAD DRESSING TELFA 3X8 NADH (GAUZE/BANDAGES/DRESSINGS) ×2 IMPLANT
PAD OB MATERNITY 4.3X12.25 (PERSONAL CARE ITEMS) ×4 IMPLANT
PENCIL BUTTON HOLSTER BLD 10FT (ELECTRODE) ×2 IMPLANT
PLUG CATH AND CAP STER (CATHETERS) ×4 IMPLANT
SET CYSTO W/LG BORE CLAMP LF (SET/KITS/TRAYS/PACK) IMPLANT
SHEET LAVH (DRAPES) ×4 IMPLANT
SPONGE GAUZE 4X4 12PLY STER LF (GAUZE/BANDAGES/DRESSINGS) ×4 IMPLANT
SPONGE LAP 18X18 X RAY DECT (DISPOSABLE) ×2 IMPLANT
SPONGE LAP 4X18 X RAY DECT (DISPOSABLE) ×4 IMPLANT
STAPLER VISISTAT 35W (STAPLE) IMPLANT
SUT CHROMIC 0 SH (SUTURE) ×11 IMPLANT
SUT CHROMIC 2 0 SH (SUTURE) ×12 IMPLANT
SUT CHROMIC 2 0 TIES 18 (SUTURE) IMPLANT
SUT MNCRL AB 3-0 PS2 27 (SUTURE) ×2 IMPLANT
SUT PDS AB 0 CT1 27 (SUTURE) ×4 IMPLANT
SUT SILK 2 0 FSL 18 (SUTURE) ×4 IMPLANT
SUT VIC AB 0 CT1 18XCR BRD8 (SUTURE) ×6 IMPLANT
SUT VIC AB 0 CT1 27 (SUTURE) ×16
SUT VIC AB 0 CT1 27XBRD ANBCTR (SUTURE) ×4 IMPLANT
SUT VIC AB 0 CT1 8-18 (SUTURE) ×12
SUT VIC AB 2-0 CT1 27 (SUTURE) ×8
SUT VIC AB 2-0 CT1 TAPERPNT 27 (SUTURE) ×4 IMPLANT
SUT VICRYL 0 TIES 12 18 (SUTURE) ×4 IMPLANT
SUT VICRYL 0 UR6 27IN ABS (SUTURE) ×8 IMPLANT
SYR 20CC LL (SYRINGE) ×2 IMPLANT
SYR 30ML LL (SYRINGE) ×2 IMPLANT
SYR CONTROL 10ML LL (SYRINGE) ×2 IMPLANT
SYR TB 1ML 25GX5/8 (SYRINGE) ×4 IMPLANT
TOWEL OR 17X24 6PK STRL BLUE (TOWEL DISPOSABLE) ×8 IMPLANT
TRAY FOLEY CATH 14FR (SET/KITS/TRAYS/PACK) ×4 IMPLANT
TUBING SUCTION BULK 100 FT (MISCELLANEOUS) ×2 IMPLANT
WATER STERILE IRR 1000ML POUR (IV SOLUTION) ×4 IMPLANT
YANKAUER SUCT BULB TIP NO VENT (SUCTIONS) ×2 IMPLANT

## 2014-05-16 NOTE — Transfer of Care (Signed)
Immediate Anesthesia Transfer of Care Note  Patient: Denver FasterJudy K Eutsler  Procedure(s) Performed: Procedure(s): TOTAL VAGINAL HYSTECTOMY  (N/A) ANTERIOR (CYSTOCELE) AND POSTERIOR REPAIR (RECTOCELE) (N/A) BURCH PROCEDURE (N/A) INCLUSION CYST REMOVAL (N/A)  Patient Location: PACU  Anesthesia Type:General  Level of Consciousness: awake, sedated and patient cooperative  Airway & Oxygen Therapy: Patient Spontanous Breathing and Patient connected to face mask oxygen  Post-op Assessment: Report given to RN and Post -op Vital signs reviewed and stable  Post vital signs: Reviewed and stable  Last Vitals:  Filed Vitals:   05/16/14 0611  BP: 140/84  Pulse: 72  Temp: 36.7 C  Resp: 16    Complications: No apparent anesthesia complications

## 2014-05-16 NOTE — Addendum Note (Signed)
Addendum  created 05/16/14 1512 by Renford DillsJanet L Tyshauna Finkbiner, CRNA   Modules edited: Notes Section   Notes Section:  File: [id number]

## 2014-05-16 NOTE — Op Note (Signed)
OPERATIVE NOTE  IZZIE GEERS  DOB:    [birth year]  MRN:    [id number]  CSN:    [id number]  Date of Surgery:  05/16/2014   Preoperative Diagnosis:  Stage I pelvic organ prolapse  Cystocele  Rectocele  Stress urinary incontinence  Abdominal subcutaneous lipoma  Postoperative Diagnosis:  Stage I pelvic organ prolapse  Cystocele  Rectocele  Stress urinary incontinence  Abdominal subcutaneous inclusion cyst   Procedure:  Vaginal hysterectomy  Anterior and posterior colporrhaphy  Burch cystourethropexy  Removal of abdominal subcutaneous inclusion cyst  Surgeon:  Leonard Schwartz, M.D.  Assistant:  Henreitta Leber, PA-C   Anesthetic:  General  Disposition:  The patient presents with the above-mentioned diagnosis. She understands the indications for surgical procedure.  She also understands the alternative treatment options. She accepts the risk of, but not limited to, anesthetic complications, bleeding, infections,urinary retention, and possible damage to the surrounding organs.  Findings:  The uterus was small. The fallopian tubes were absent. The ovaries appeared normal bilaterally. The patient had a stage I pelvic organ prolapse with a cystocele and rectocele. There was loss of the urethrovesical angle. There was a 3 cm inclusion cyst in the subcutaneous layer in the lower abdomen.   Procedure:  The patient was taken to the operating room where a general anesthetic was given. The patient's lower abdomen, perineum, and vagina were prepped with multiple layers of Betadine. A Foley catheter was placed in the bladder. The patient was sterilely draped. An examination under anesthesia was performed. Operative findings are mentioned above. The cervix was injected with half percent Marcaine with epinephrine. A circumferential incision was made around the cervix. The vaginal mucosa was advanced anteriorly and posteriorly. The anterior cul-de-sac and in the  posterior cul-de-sac were sharply entered. Alternating from right to left the uterosacral ligaments, paracervical tissues, parametrial tissues, and uterine arteries were clamped, cut, sutured, and tied securely. The upper pedicles were then clamped and cut. The uterus was removed from the operative field. The upper pedicles were secured using free ties and then suture ligatures. Hemostasis was confirmed. The sutures attached to the uterosacral ligaments were brought out through the vaginal angles and then tied securely.  uterosacral ligament plication was performed using 0 Vicryl suture. A McCall culdoplasty suture was placed in the posterior cul-de-sac incorporating the uterosacral ligaments bilaterally and the posterior peritoneum. A final check was made for hemostasis and again hemostasis was confirmed. The vaginal cuff was closed using figure-of-eight sutures incorporating the anterior vaginal mucosa, the anterior peritoneum, posterior peritoneum, and the posterior vaginal mucosa. The McCall culdoplasty suture was tied securely and the apex of the vagina was noted to elevate into the midpelvis. The patient tolerated her procedure well.  The anterior vaginal mucosa was injected with Marcaine with epinephrine. An incision was made in the midline. The vaginal mucosa was advanced from the underlying bladder and supporting fascia. The fascia and supporting tissue were then reapproximated in the midline using interrupted sutures of 0 chromic and 0 Vicryl suture. This reduced the cystocele. The excess vaginal mucosa was excised. The vaginal mucosa was then sutured in the midline using interrupted sutures of 0 Vicryl. The vaginal mucosa over the rectum was then injected with half percent Marcaine and epinephrine. The vaginal mucosa was separated from the underlying fascia and supporting tissue. The fascia and the supporting tissue were reapproximated in the midline. The perineal body was reinforced. The excess vaginal  mucosa was excised. The rectocele was effectively reduced. The vaginal  mucosa was then sutured in the midline using a running suture of 0 Vicryl. The operator then changed gown and gloves. A low transverse incision was made in the abdomen. The inclusion cyst in the subcutaneous layer was sharply removed. The incision was carried down to the layer of the fascia. The fascia was incised in the midline. The space of Retzius was entered. The operator then elevated the urethrovesical angle. An interrupted suture of 0 Vicryl was placed at the level of the urethrovesical junction bilaterally. Because of extensive vasculature and adipose tissue, a second suture was not placed. The bladder was then filled with sterile milk. There was no leakage into the operative field. The bladder was felt to be intact. The operator then changed into sterile gloves. The sutures were attached to Cooper's ligament. The assistant then elevated the bladder. The sutures were tied with appropriate tension. Care was taken not to over tie the suture. Hemostasis was noted to be adequate. Exparel was placed in the space of Retzius. The fascia was closed using a running suture of 0 Vicryl. The subcutaneous layer was closed using interrupted sutures of 0 Vicryl. The skin was reapproximated using 3-0 Monocryl. The vagina was then packed with gauze soaked with Estrace cream. She was awakened from her anesthetic without difficulty and then transported to the recovery room in stable condition. Sponge, needle, and instrument counts were correct on 2 occasions. The estimated blood loss was 50 cc's. 0 Vicryl is the suture material used throughout the procedure. The uterus  and inclusion cyst were sent to pathology.   Leonard SchwartzArthur Vernon Della Homan, M.D.

## 2014-05-16 NOTE — Progress Notes (Signed)
Subjective: Patient reports that she is comfortable with her pain medication.  Objective:  BP 116/71 mmHg  Pulse 98  Temp(Src) 98 F (36.7 C) (Oral)  Resp 16  Ht 5\' 5"  (1.651 m)  Wt 155 lb (70.308 kg)  BMI 25.79 kg/m2  SpO2 97%   General: cooperative Resp: clear to auscultation bilaterally Cardio: regular rate and rhythm, S1, S2 normal, no murmur, click, rub or gallop GI: soft, non-tender; bowel sounds normal; no masses,  no organomegaly   Assessment/Plan:  Doing well after surgery.  Routine care today. Discharge in the morning.   Halle Davlin V 05/16/2014, 1:34 PM

## 2014-05-16 NOTE — Anesthesia Preprocedure Evaluation (Signed)

## 2014-05-16 NOTE — Anesthesia Postprocedure Evaluation (Signed)
  Anesthesia Post-op Note  Patient: Alexis Barajas  Procedure(s) Performed: Procedure(s): TOTAL VAGINAL HYSTECTOMY  (N/A) ANTERIOR (CYSTOCELE) AND POSTERIOR REPAIR (RECTOCELE) (N/A) BURCH PROCEDURE (N/A) INCLUSION CYST REMOVAL (N/A)  Patient Location: PACU  Anesthesia Type:General  Level of Consciousness: awake, alert  and oriented  Airway and Oxygen Therapy: Patient Spontanous Breathing and Patient connected to nasal cannula oxygen  Post-op Pain: mild  Post-op Assessment: Post-op Vital signs reviewed, Patient's Cardiovascular Status Stable, Respiratory Function Stable, Patent Airway, No signs of Nausea or vomiting and Pain level controlled  Post-op Vital Signs: Reviewed and stable  Last Vitals:  Filed Vitals:   05/16/14 1100  BP: 117/61  Pulse: 94  Temp:   Resp: 14    Complications: No apparent anesthesia complications

## 2014-05-16 NOTE — Anesthesia Procedure Notes (Addendum)
Procedure Name: Intubation Date/Time: 05/16/2014 7:30 AM Performed by: Suella GroveMOORE, Taichi Repka C Pre-anesthesia Checklist: Patient identified, Patient being monitored, Emergency Drugs available, Timeout performed and Suction available Patient Re-evaluated:Patient Re-evaluated prior to inductionOxygen Delivery Method: Circle system utilized and Simple face mask Preoxygenation: Pre-oxygenation with 100% oxygen Intubation Type: IV induction Ventilation: Mask ventilation without difficulty Laryngoscope Size: Mac and 3 Grade View: Grade II Tube type: Oral Number of attempts: 1 Airway Equipment and Method: Stylet Placement Confirmation: ETT inserted through vocal cords under direct vision,  breath sounds checked- equal and bilateral and positive ETCO2 Secured at: 21 cm Tube secured with: Tape

## 2014-05-16 NOTE — H&P (Signed)
The patient was interviewed and examined today.  The previously documented history and physical examination was reviewed. There are no changes. The operative procedure was reviewed. The risks and benefits were outlined again. The specific risks include, but are not limited to, anesthetic complications, bleeding, infections, and possible damage to the surrounding organs. The patient's questions were answered.  We are ready to proceed as outlined. The likelihood of the patient achieving the goals of this procedure is very likely.  BP 140/84 mmHg  Pulse 72  Temp(Src) 98.1 F (36.7 C) (Oral)  Resp 16  SpO2 98%  CBC    Component Value Date/Time   WBC 3.5* 05/12/2014 0950   RBC 4.85 05/12/2014 0950   HGB 12.4 05/12/2014 0950   HCT 38.7 05/12/2014 0950   PLT 202 05/12/2014 0950   MCV 79.8 05/12/2014 0950   MCH 25.6* 05/12/2014 0950   MCHC 32.0 05/12/2014 0950   RDW 14.2 05/12/2014 0950   LYMPHSABS 1.9 10/06/2011 0238   MONOABS 0.3 10/06/2011 0238   EOSABS 0.2 10/06/2011 0238   BASOSABS 0.0 10/06/2011 0238   Leonard SchwartzArthur Vernon Chevette Fee, M.D.

## 2014-05-16 NOTE — Anesthesia Postprocedure Evaluation (Signed)
  Anesthesia Post-op Note  Patient: Alexis Barajas  Procedure(s) Performed: Procedure(s): TOTAL VAGINAL HYSTECTOMY  (N/A) ANTERIOR (CYSTOCELE) AND POSTERIOR REPAIR (RECTOCELE) (N/A) BURCH PROCEDURE (N/A) INCLUSION CYST REMOVAL (N/A)  Patient Location: [facility]  Anesthesia Type:General  Level of Consciousness: awake  Airway and Oxygen Therapy: Patient Spontanous Breathing  Post-op Pain: mild  Post-op Assessment: Patient's Cardiovascular Status Stable and Respiratory Function Stable  Post-op Vital Signs: stable  Last Vitals:  Filed Vitals:   05/16/14 1320  BP: 116/71  Pulse: 98  Temp: 36.7 C  Resp: 16    Complications: No apparent anesthesia complications

## 2014-05-17 ENCOUNTER — Inpatient Hospital Stay (HOSPITAL_COMMUNITY)
Admission: AD | Admit: 2014-05-17 | Discharge: 2014-05-17 | Disposition: A | Discharge status: Home / Self Care | Payer: BLUE CROSS/BLUE SHIELD | Source: Ambulatory Visit | Attending: Obstetrics & Gynecology | Admitting: Obstetrics & Gynecology

## 2014-05-17 ENCOUNTER — Encounter (HOSPITAL_COMMUNITY): Payer: Self-pay | Admitting: Obstetrics and Gynecology

## 2014-05-17 DIAGNOSIS — G8918 Other acute postprocedural pain: Secondary | ICD-10-CM

## 2014-05-17 DIAGNOSIS — R109 Unspecified abdominal pain: Secondary | ICD-10-CM | POA: Diagnosis not present

## 2014-05-17 DIAGNOSIS — N814 Uterovaginal prolapse, unspecified: Secondary | ICD-10-CM | POA: Diagnosis not present

## 2014-05-17 LAB — CBC
HEMATOCRIT: 33.1 % — AB (ref 36.0–46.0)
HEMOGLOBIN: 11 g/dL — AB (ref 12.0–15.0)
MCH: 26.8 pg (ref 26.0–34.0)
MCHC: 33.2 g/dL (ref 30.0–36.0)
MCV: 80.7 fL (ref 78.0–100.0)
Platelets: 173 10*3/uL (ref 150–400)
RBC: 4.1 MIL/uL (ref 3.87–5.11)
RDW: 14.5 % (ref 11.5–15.5)
WBC: 9.8 10*3/uL (ref 4.0–10.5)

## 2014-05-17 MED ORDER — PROMETHAZINE HCL 25 MG/ML IJ SOLN
12.5000 mg | Freq: Once | INTRAMUSCULAR | Status: AC
  Administered 2014-05-17: 12.5 mg via INTRAMUSCULAR
  Filled 2014-05-17: qty 1

## 2014-05-17 MED ORDER — ESTRADIOL 0.1 MG/GM VA CREA: 0.2500 | TOPICAL_CREAM | Freq: Every day | VAGINAL | Status: AC

## 2014-05-17 MED ORDER — HYDROMORPHONE HCL 2 MG PO TABS: 2.0000 mg | ORAL_TABLET | Freq: Four times a day (QID) | ORAL | Status: DC | PRN

## 2014-05-17 MED ORDER — MORPHINE SULFATE 10 MG/ML IJ SOLN
10.0000 mg | Freq: Once | INTRAMUSCULAR | Status: AC
  Administered 2014-05-17: 10 mg via INTRAMUSCULAR
  Filled 2014-05-17: qty 1

## 2014-05-17 MED ORDER — IBUPROFEN 600 MG PO TABS: ORAL_TABLET | ORAL | Status: DC

## 2014-05-17 MED ORDER — FAMOTIDINE 20 MG PO TABS: 20.0000 mg | ORAL_TABLET | Freq: Every evening | ORAL | Status: DC | PRN

## 2014-05-17 NOTE — MAU Provider Note (Signed)
History  Alexis MormonJudy K. Barajas is a 59 year old female pt of Dr. Debria GarretStringer's who is s/p vaginal hysterectomy, anterior and posterior colporrhaphy, burch cystourethropexy and removal of abdominal sub q inclusion cyst on 05/16/14. Voiding without event. Tolerating regular diet. She was discharged today and presents tonight w/ c/o 10/10 pain. Only medication taken since discharge was Ultram at 15:30 which she states made her dizzy and nauseated 30 min after taking. At discharge she was prescribed Ibuprofen, Dilaudid, Ultram and Pepcid but was already headed to the hospital before other meds were picked up.  Patient Active Problem List   Diagnosis Date Noted  . Postoperative abdominal pain 05/17/2014  . Pelvic prolapse 05/16/2014    Chief Complaint  Patient presents with  . Post-op Problem   HPI As above OB History    No data available      Past Medical History  Diagnosis Date  . SVD (spontaneous vaginal delivery)     x 3  . Depression     no meds  . Anxiety     no meds  . Arthritis     hands, lower back - no meds    Past Surgical History  Procedure Laterality Date  . Right leg surgery    . Leg surgery      metal plate with screws  . Tubal ligation    . Wisdom tooth extraction    . Vaginal hysterectomy N/A 05/16/2014    Procedure: TOTAL VAGINAL HYSTECTOMY ;  Surgeon: Kirkland HunArthur Stringer, MD;  Location: [facility];  Service: Gynecology;  Laterality: N/A;  . Anterior and posterior repair N/A 05/16/2014    Procedure: ANTERIOR (CYSTOCELE) AND POSTERIOR REPAIR (RECTOCELE);  Surgeon: Kirkland HunArthur Stringer, MD;  Location: [facility];  Service: Gynecology;  Laterality: N/A;  Uvaldo Bristle. Burch procedure N/A 05/16/2014    Procedure: BURCH PROCEDURE;  Surgeon: Kirkland HunArthur Stringer, MD;  Location: [facility];  Service: Gynecology;  Laterality: N/A;  . Ear cyst excision N/A 05/16/2014    Procedure: INCLUSION CYST REMOVAL;  Surgeon: Kirkland HunArthur Stringer, MD;  Location: [facility];  Service: Gynecology;  Laterality: N/A;    No family history on  file.  History  Substance Use Topics  . Smoking status: Never Smoker   . Smokeless tobacco: Never Used  . Alcohol Use: No    Allergies:  Allergies  Allergen Reactions  . Oxycodone Rash    Patient is allergic to narcotic pain medication.  Marland Kitchen. Penicillins Rash    No prescriptions prior to admission    ROS  Post-op pain Physical Exam   Blood pressure 136/83, pulse 85, temperature 98.4 F (36.9 C), temperature source Oral, resp. rate 16. Today's Vitals   05/17/14 1943 05/17/14 2139  BP:  136/83  Pulse:  85  Temp:  98.4 F (36.9 C)  TempSrc:  Oral  Resp:  16  PainSc: 10-Worst pain ever 1      Physical Exam Gen: Visually uncomfortable Lungs: CTAB CV: RRR Abdomen: soft, appropriately tender, dressing c/d/i ED Course  Morphine IM Phenergan IM  Assessment: 59 yo recently discharged with poor pain control relieved w/ above meds.   Plan: Dr. Stefano GaulStringer consulted Instructed to take Ibuprofen and Dilaudid around the clock. D/C Tramadol for now F/U per d/c instructions Call w/ questions/concerns   Sherre ScarletWILLIAMS, Aayliah Rotenberry CNM, MS 05/18/2014 12:04 AM

## 2014-05-17 NOTE — MAU Note (Signed)
Pt states she has had pain all day.Pt states she took tramadol for pain at 1530  and it made her nausea. And dizzy.

## 2014-05-17 NOTE — Discharge Summary (Signed)
Physician Discharge Summary  Patient ID: Alexis Barajas MRN: [id number] DOB/AGE: Jan 25, 1956 59 y.o.  Admit date: 05/16/2014 Discharge date: 05/17/2014   Discharge Diagnoses: Pelvic Prolapse Active Problems:   Pelvic prolapse   Operation: Total Vaginal Hysterectomy, Anterior and Posterior Colporrhaphy with Burch Cystourethropexy   Discharged Condition: Good  Hospital Course: On the date of admission the patient underwent the aforementioned procedures and tolerated them well.   Post operative course was unremarkable with the patient resuming bowel and bladder function by post operative day #1 and wan therefore deemed ready for discharge home.  Discharge hemoglobin was 11.0.  Disposition: 01-Home or Self Care  Discharge Medications:    Medication List    TAKE these medications        calcium carbonate 500 MG chewable tablet  Commonly known as:  TUMS - dosed in mg elemental calcium  Chew 1 tablet by mouth daily as needed. For indigestion     famotidine 20 MG tablet  Commonly known as:  PEPCID  Take 1 tablet (20 mg total) by mouth at bedtime as needed for heartburn.     HYDROmorphone 2 MG tablet  Commonly known as:  DILAUDID  Take 1 tablet (2 mg total) by mouth every 6 (six) hours as needed for moderate pain or severe pain.     ibuprofen 600 MG tablet  Commonly known as:  ADVIL,MOTRIN  1 PO PC every 6 hours for 5 days then prn-pain         Follow-up: Dr. Janine LimboArthur V. Stringer on June 21, 2014 at 10:45 a.m.    SignedHenreitta Leber: Shayann Garbutt, PA-C 05/17/2014, 7:12 AM

## 2014-05-17 NOTE — Progress Notes (Signed)
Ur chart review completed.  

## 2014-05-17 NOTE — Progress Notes (Signed)
PROGRESS NOTE  I have reviewed the patient's vital signs, labs, and notes. I have examined the patient. I agree with the previous note from the physician assistant.  BP 108/57 mmHg  Pulse 74  Temp(Src) 98.9 F (37.2 C) (Oral)  Resp 18  Ht 5\' 5"  (1.651 m)  Wt 155 lb (70.308 kg)  BMI 25.79 kg/m2  SpO2 96%  CBC    Component Value Date/Time   WBC 9.8 05/17/2014 0510   RBC 4.10 05/17/2014 0510   HGB 11.0* 05/17/2014 0510   HCT 33.1* 05/17/2014 0510   PLT 173 05/17/2014 0510   MCV 80.7 05/17/2014 0510   MCH 26.8 05/17/2014 0510   MCHC 33.2 05/17/2014 0510   RDW 14.5 05/17/2014 0510   LYMPHSABS 1.9 10/06/2011 0238   MONOABS 0.3 10/06/2011 0238   EOSABS 0.2 10/06/2011 0238   BASOSABS 0.0 10/06/2011 0238    The patient has voided 125 cc's.  Leonard SchwartzArthur Vernon Bryton Waight, M.D. 05/17/2014

## 2014-05-17 NOTE — Discharge Instructions (Signed)
Call [facility] @ [phone] if:  You have a temperature greater than or equal to 100.4 degrees Farenheit orally You have pain that is not made better by the pain medication given and taken as directed You have excessive bleeding or problems urinating  Take Colace (Docusate Sodium/Stool Softener) 100 mg 2-3 times daily while taking narcotic pain medicine to avoid constipation or until bowel movements are regular. Take Ibuprofen 600 mg with food while awake, every 6 hours for 5 days then as needed for pain  You may drive after 2  weeks You may walk up steps  You may shower  You may resume a regular diet  Keep incisions clean and dry  Do not lift over 15 pounds for 6 weeks Avoid anything in vagina for 6 weeks (or until after your post-operative visit)

## 2014-05-17 NOTE — Progress Notes (Signed)
Pt wheeled out in wheelchair smiling, Pt state she pain score is 1

## 2014-05-17 NOTE — Progress Notes (Signed)
Nigel MormonJudy K Whang is a58 y.o.  [id number]  Post Op Day #1:  TVH/A-P Colporrhaphy/Burch Procedure  Subjective: Patient is doing well.  Pain managed with Motrin alone, ambulating, tolerating liquids but hasn't voided yet. Reports slight numbness of left leg but no weakness.  Objective: Vital signs in last 24 hours: Temp:  [98 F (36.7 C)-99 F (37.2 C)] 98.9 F (37.2 C) (03/16 0530) Pulse Rate:  [74-103] 74 (03/16 0530) Resp:  [12-20] 18 (03/16 0530) BP: (100-133)/(52-71) 108/57 mmHg (03/16 0530) SpO2:  [92 %-100 %] 96 % (03/16 0530) FiO2 (%):  [2 %] 2 % (03/15 1320) Weight:  [155 lb (70.308 kg)] 155 lb (70.308 kg) (03/15 1242)  Intake/Output from previous day: 03/15 0701 - 03/16 0700 In: 2960 [P.O.:660; I.V.:2300] Out: 2650 [Urine:2600] Intake/Output this shift:    Recent Labs Lab 05/12/14 0950 05/17/14 0510  WBC 3.5* 9.8  HGB 12.4 11.0*  HCT 38.7 33.1*  PLT 202 173    No results for input(s): NA, K, CL, CO2, BUN, CREATININE, CALCIUM, PROT, BILITOT, ALKPHOS, ALT, AST, GLUCOSE in the last 168 hours.  Invalid input(s): LABALBU  EXAM: General: alert, cooperative and no distress Resp: clear to auscultation bilaterally Cardio: regular rate and rhythm, S1, S2 normal, no murmur, click, rub or gallop GI: Bowel sounds present,soft;  dressing clean/dry and intact. Extremities: no calf tenderness and negative Homan's   Assessment: s/p Procedure(s): TOTAL VAGINAL HYSTECTOMY  ANTERIOR (CYSTOCELE) AND POSTERIOR REPAIR (RECTOCELE) BURCH PROCEDURE INCLUSION CYST REMOVAL: stable and progressing well  Plan: Advance diet Awaiting patient to void  Discharge home later today     Matei Magnone, PA-C 05/17/2014 7:01 AM

## 2014-05-17 NOTE — Progress Notes (Signed)
Pt is discharged in the care of husband with R,N, escort. Denies any pain or discomfort. Spirits are good. Discharge instructions with Rx were given to pt. ,with good understanding. Questions  were asked and answered.Voiding without difficulty. No equipment needed  for home use.

## 2014-05-22 ENCOUNTER — Inpatient Hospital Stay (HOSPITAL_COMMUNITY)
Admission: AD | Admit: 2014-05-22 | Discharge: 2014-05-22 | Disposition: A | Discharge status: Home / Self Care | Payer: BLUE CROSS/BLUE SHIELD | Source: Ambulatory Visit | Attending: Obstetrics and Gynecology | Admitting: Obstetrics and Gynecology

## 2014-05-22 ENCOUNTER — Encounter (HOSPITAL_COMMUNITY): Payer: Self-pay

## 2014-05-22 DIAGNOSIS — G8918 Other acute postprocedural pain: Secondary | ICD-10-CM | POA: Diagnosis not present

## 2014-05-22 DIAGNOSIS — R109 Unspecified abdominal pain: Secondary | ICD-10-CM | POA: Diagnosis present

## 2014-05-22 DIAGNOSIS — R200 Anesthesia of skin: Secondary | ICD-10-CM | POA: Insufficient documentation

## 2014-05-22 DIAGNOSIS — R0600 Dyspnea, unspecified: Secondary | ICD-10-CM | POA: Diagnosis not present

## 2014-05-22 DIAGNOSIS — M7989 Other specified soft tissue disorders: Secondary | ICD-10-CM | POA: Diagnosis not present

## 2014-05-22 DIAGNOSIS — R110 Nausea: Secondary | ICD-10-CM | POA: Diagnosis not present

## 2014-05-22 DIAGNOSIS — M79605 Pain in left leg: Secondary | ICD-10-CM | POA: Diagnosis not present

## 2014-05-22 LAB — CBC WITH DIFFERENTIAL/PLATELET
BASOS ABS: 0 10*3/uL (ref 0.0–0.1)
Basophils Relative: 0 % (ref 0–1)
EOS ABS: 0.1 10*3/uL (ref 0.0–0.7)
EOS PCT: 3 % (ref 0–5)
HEMATOCRIT: 35.7 % — AB (ref 36.0–46.0)
HEMOGLOBIN: 11.9 g/dL — AB (ref 12.0–15.0)
Lymphocytes Relative: 18 % (ref 12–46)
Lymphs Abs: 0.8 10*3/uL (ref 0.7–4.0)
MCH: 26.5 pg (ref 26.0–34.0)
MCHC: 33.3 g/dL (ref 30.0–36.0)
MCV: 79.5 fL (ref 78.0–100.0)
MONOS PCT: 13 % — AB (ref 3–12)
Monocytes Absolute: 0.5 10*3/uL (ref 0.1–1.0)
Neutro Abs: 2.7 10*3/uL (ref 1.7–7.7)
Neutrophils Relative %: 66 % (ref 43–77)
Platelets: 201 10*3/uL (ref 150–400)
RBC: 4.49 MIL/uL (ref 3.87–5.11)
RDW: 14.2 % (ref 11.5–15.5)
WBC: 4.1 10*3/uL (ref 4.0–10.5)

## 2014-05-22 LAB — URINALYSIS, ROUTINE W REFLEX MICROSCOPIC
Bilirubin Urine: NEGATIVE
GLUCOSE, UA: NEGATIVE mg/dL
Ketones, ur: NEGATIVE mg/dL
Nitrite: NEGATIVE
PH: 7 (ref 5.0–8.0)
Protein, ur: NEGATIVE mg/dL
Specific Gravity, Urine: 1.01 (ref 1.005–1.030)
Urobilinogen, UA: 0.2 mg/dL (ref 0.0–1.0)

## 2014-05-22 LAB — URINE MICROSCOPIC-ADD ON

## 2014-05-22 MED ORDER — ONDANSETRON HCL 4 MG PO TABS: 4.0000 mg | ORAL_TABLET | Freq: Three times a day (TID) | ORAL | Status: DC | PRN

## 2014-05-22 MED ORDER — ONDANSETRON HCL 4 MG PO TABS
4.0000 mg | ORAL_TABLET | Freq: Once | ORAL | Status: AC
  Administered 2014-05-22: 4 mg via ORAL
  Filled 2014-05-22: qty 1

## 2014-05-22 NOTE — Progress Notes (Signed)
*  PRELIMINARY RESULTS* Vascular Ultrasound Left lower extremity venous duplex has been completed.  Preliminary findings: negative for DVT.   Farrel DemarkJill Eunice, RDMS, RVT  05/22/2014, 3:13 PM

## 2014-05-22 NOTE — MAU Provider Note (Signed)
Alexis Barajas is a 59 y.o.post op day 6 for Vaginal hysterectomy, Anterior and posterior colporrhaphy, Burch cystourethropexy, and  Removal of abdominal subcutaneous inclusion cyst.   Today she c/o having difficulty breathing, L side pain, L leg feels numb & painful - started @ 0430 this a.m. Temp 101 @ home this a.m. Small amount vaginal bleeding.          History     Patient Active Problem List   Diagnosis Date Noted  . Postoperative abdominal pain 05/17/2014  . Pelvic prolapse 05/16/2014    Chief Complaint  Patient presents with  . Abdominal Pain  . Leg Pain  . leg numbness    HPI  OB History    No data available      Past Medical History  Diagnosis Date  . SVD (spontaneous vaginal delivery)     x 3  . Depression     no meds  . Anxiety     no meds  . Arthritis     hands, lower back - no meds    Past Surgical History  Procedure Laterality Date  . Right leg surgery    . Leg surgery      metal plate with screws  . Tubal ligation    . Wisdom tooth extraction    . Vaginal hysterectomy N/A 05/16/2014    Procedure: TOTAL VAGINAL HYSTECTOMY ;  Surgeon: Kirkland Hun, MD;  Location: [facility];  Service: Gynecology;  Laterality: N/A;  . Anterior and posterior repair N/A 05/16/2014    Procedure: ANTERIOR (CYSTOCELE) AND POSTERIOR REPAIR (RECTOCELE);  Surgeon: Kirkland Hun, MD;  Location: [facility];  Service: Gynecology;  Laterality: N/A;  Uvaldo Bristle procedure N/A 05/16/2014    Procedure: BURCH PROCEDURE;  Surgeon: Kirkland Hun, MD;  Location: [facility];  Service: Gynecology;  Laterality: N/A;  . Ear cyst excision N/A 05/16/2014    Procedure: INCLUSION CYST REMOVAL;  Surgeon: Kirkland Hun, MD;  Location: [facility];  Service: Gynecology;  Laterality: N/A;    History reviewed. No pertinent family history.  History  Substance Use Topics  . Smoking status: Never Smoker   . Smokeless tobacco: Never Used  . Alcohol Use: No    Allergies:  Allergies  Allergen Reactions   . Oxycodone Rash    Patient is allergic to narcotic pain medication.  Marland Kitchen Penicillins Rash    Prescriptions prior to admission  Medication Sig Dispense Refill Last Dose  . calcium carbonate (TUMS - DOSED IN MG ELEMENTAL CALCIUM) 500 MG chewable tablet Chew 1 tablet by mouth daily as needed. For indigestion   Past Week at Unknown time  . estradiol (ESTRACE VAGINAL) 0.1 MG/GM vaginal cream Place 0.25 Applicatorfuls vaginally at bedtime. (Patient not taking: Reported on 05/17/2014) 42.5 g 12   . famotidine (PEPCID) 20 MG tablet Take 1 tablet (20 mg total) by mouth at bedtime as needed for heartburn. (Patient not taking: Reported on 05/17/2014) 30 tablet 0   . HYDROmorphone (DILAUDID) 2 MG tablet Take 1 tablet (2 mg total) by mouth every 6 (six) hours as needed for moderate pain or severe pain. (Patient not taking: Reported on 05/17/2014) 30 tablet 0   . ibuprofen (ADVIL,MOTRIN) 600 MG tablet 1 PO PC every 6 hours for 5 days then prn-pain (Patient taking differently: Take 600 mg by mouth every 6 (six) hours as needed for moderate pain. 1 PO PC every 6 hours for 5 days then prn-pain) 30 tablet 1 05/17/2014 at Unknown time  . traMADol Janean Sark)  50 MG tablet Take 50 mg by mouth every 6 (six) hours as needed for moderate pain or severe pain.   05/17/2014 at Unknown time    ROS See HPI above, all other systems are negative  Physical Exam   Blood pressure 116/70, pulse 89, temperature 98.8 F (37.1 C), temperature source Oral, resp. rate 18, SpO2 98 %.  Physical Exam Ext:  WNL ABD: Soft, non tender to palpation, no rebound or guarding.  Incision healing appropriately, no redness, no drainage, no tenderness Right Mons bruising directly below the incision SVE: deferred LE, no current pain Lungs CTA VSSAF  ED Course  Assessment: Left LE pain   Plan: Doppler study  Aras Albarran, CNM, MSN 05/22/2014. 1:37 PM

## 2014-05-22 NOTE — Discharge Instructions (Signed)

## 2014-05-22 NOTE — MAU Provider Note (Signed)
MAU Addendum Note  Pt c/o nausea  Results for orders placed or performed during the hospital encounter of 05/22/14 (from the past 24 hour(s))  Urinalysis, Routine w reflex microscopic     Status: Abnormal   Collection Time: 05/22/14  1:13 PM  Result Value Ref Range   Color, Urine YELLOW YELLOW   APPearance CLEAR CLEAR   Specific Gravity, Urine 1.010 1.005 - 1.030   pH 7.0 5.0 - 8.0   Glucose, UA NEGATIVE NEGATIVE mg/dL   Hgb urine dipstick LARGE (A) NEGATIVE   Bilirubin Urine NEGATIVE NEGATIVE   Ketones, ur NEGATIVE NEGATIVE mg/dL   Protein, ur NEGATIVE NEGATIVE mg/dL   Urobilinogen, UA 0.2 0.0 - 1.0 mg/dL   Nitrite NEGATIVE NEGATIVE   Leukocytes, UA MODERATE (A) NEGATIVE  Urine microscopic-add on     Status: None   Collection Time: 05/22/14  1:13 PM  Result Value Ref Range   Squamous Epithelial / LPF RARE RARE   WBC, UA 11-20 <3 WBC/hpf   RBC / HPF 3-6 <3 RBC/hpf   Bacteria, UA RARE RARE  CBC with Differential     Status: Abnormal   Collection Time: 05/22/14  4:10 PM  Result Value Ref Range   WBC 4.1 4.0 - 10.5 K/uL   RBC 4.49 3.87 - 5.11 MIL/uL   Hemoglobin 11.9 (L) 12.0 - 15.0 g/dL   HCT 59.535.7 (L) 63.836.0 - 75.646.0 %   MCV 79.5 78.0 - 100.0 fL   MCH 26.5 26.0 - 34.0 pg   MCHC 33.3 30.0 - 36.0 g/dL   RDW 43.314.2 29.511.5 - 18.815.5 %   Platelets 201 150 - 400 K/uL   Neutrophils Relative % 66 43 - 77 %   Neutro Abs 2.7 1.7 - 7.7 K/uL   Lymphocytes Relative 18 12 - 46 %   Lymphs Abs 0.8 0.7 - 4.0 K/uL   Monocytes Relative 13 (H) 3 - 12 %   Monocytes Absolute 0.5 0.1 - 1.0 K/uL   Eosinophils Relative 3 0 - 5 %   Eosinophils Absolute 0.1 0.0 - 0.7 K/uL   Basophils Relative 0 0 - 1 %   Basophils Absolute 0.0 0.0 - 0.1 K/uL    Plan: -Rx for zofran -Discussed need to follow up in office  -Encouraged to call if any questions or concerns arise prior to next scheduled office visit.  -Discharged to home in stable condition Consulted with Dr. Estanislado Pandyivard Dr Stefano GaulStringer coming for a social  visit   Vayda Dungee, CNM, MSN 05/22/2014. 4:00 PM

## 2014-05-22 NOTE — MAU Note (Signed)
Hysterectomy on 3/15, HA since yesterday, having difficulty breathing today, L side pain, L leg feels numb & painful - started @ 0430 this a.m.  Temp 101 @ home this a.m.  Small amount vaginal bleeding.

## 2014-05-23 NOTE — Consult Note (Signed)
PROGRESS NOTE  The [facility].  She is status post vaginal hysterectomy, anterior and posterior colporrhaphy, and Burch cystourethropexy. She complains of having a fever, pain in her left leg, and nausea.  Her evaluation is negative except for blood in her urine.  I have reviewed the patient's vital signs, labs, and notes. I have examined the patient. I agree with the previous note from the Certified Nurse Midwife.  Leonard SchwartzArthur Vernon Jream Broyles, M.D. 05/23/2014

## 2014-05-31 ENCOUNTER — Emergency Department (HOSPITAL_COMMUNITY): Payer: BLUE CROSS/BLUE SHIELD

## 2014-05-31 ENCOUNTER — Encounter (HOSPITAL_COMMUNITY): Payer: Self-pay | Admitting: *Deleted

## 2014-05-31 ENCOUNTER — Emergency Department (HOSPITAL_COMMUNITY)
Admission: EM | Admit: 2014-05-31 | Discharge: 2014-05-31 | Disposition: A | Discharge status: Home / Self Care | Payer: BLUE CROSS/BLUE SHIELD | Attending: Emergency Medicine | Admitting: Emergency Medicine

## 2014-05-31 DIAGNOSIS — Z880 Allergy status to penicillin: Secondary | ICD-10-CM | POA: Diagnosis not present

## 2014-05-31 DIAGNOSIS — Z8659 Personal history of other mental and behavioral disorders: Secondary | ICD-10-CM | POA: Diagnosis not present

## 2014-05-31 DIAGNOSIS — R0602 Shortness of breath: Secondary | ICD-10-CM | POA: Insufficient documentation

## 2014-05-31 DIAGNOSIS — Z79899 Other long term (current) drug therapy: Secondary | ICD-10-CM | POA: Diagnosis not present

## 2014-05-31 DIAGNOSIS — Z8739 Personal history of other diseases of the musculoskeletal system and connective tissue: Secondary | ICD-10-CM | POA: Insufficient documentation

## 2014-05-31 LAB — COMPREHENSIVE METABOLIC PANEL
ALT: 24 U/L (ref 0–35)
ANION GAP: 9 (ref 5–15)
AST: 30 U/L (ref 0–37)
Albumin: 4.1 g/dL (ref 3.5–5.2)
Alkaline Phosphatase: 87 U/L (ref 39–117)
BILIRUBIN TOTAL: 0.8 mg/dL (ref 0.3–1.2)
BUN: 8 mg/dL (ref 6–23)
CALCIUM: 9.2 mg/dL (ref 8.4–10.5)
CHLORIDE: 101 mmol/L (ref 96–112)
CO2: 26 mmol/L (ref 19–32)
CREATININE: 0.64 mg/dL (ref 0.50–1.10)
GFR calc Af Amer: >90 mL/min (ref >90)
GFR calc non Af Amer: >90 mL/min (ref >90)
GLUCOSE: 103 mg/dL — AB (ref 70–99)
Potassium: 3.5 mmol/L (ref 3.5–5.1)
SODIUM: 136 mmol/L (ref 135–145)
Total Protein: 8.2 g/dL (ref 6.0–8.3)

## 2014-05-31 LAB — CBC WITH DIFFERENTIAL/PLATELET
BASOS ABS: 0 10*3/uL (ref 0.0–0.1)
BASOS PCT: 0 % (ref 0–1)
Eosinophils Absolute: 0.2 10*3/uL (ref 0.0–0.7)
Eosinophils Relative: 2 % (ref 0–5)
HEMATOCRIT: 40.1 % (ref 36.0–46.0)
HEMOGLOBIN: 13.3 g/dL (ref 12.0–15.0)
Lymphocytes Relative: 22 % (ref 12–46)
Lymphs Abs: 1.5 10*3/uL (ref 0.7–4.0)
MCH: 26.1 pg (ref 26.0–34.0)
MCHC: 33.2 g/dL (ref 30.0–36.0)
MCV: 78.6 fL (ref 78.0–100.0)
MONOS PCT: 6 % (ref 3–12)
Monocytes Absolute: 0.4 10*3/uL (ref 0.1–1.0)
NEUTROS ABS: 4.8 10*3/uL (ref 1.7–7.7)
NEUTROS PCT: 70 % (ref 43–77)
PLATELETS: 286 10*3/uL (ref 150–400)
RBC: 5.1 MIL/uL (ref 3.87–5.11)
RDW: 13.8 % (ref 11.5–15.5)
WBC: 6.8 10*3/uL (ref 4.0–10.5)

## 2014-05-31 LAB — BRAIN NATRIURETIC PEPTIDE: B Natriuretic Peptide: 27.5 pg/mL (ref 0.0–100.0)

## 2014-05-31 MED ORDER — IOHEXOL 350 MG/ML SOLN
100.0000 mL | Freq: Once | INTRAVENOUS | Status: AC | PRN
  Administered 2014-05-31: 100 mL via INTRAVENOUS

## 2014-05-31 NOTE — ED Provider Notes (Addendum)
CSN: [id number]     Arrival date & time 05/31/14  1720 History   First MD Initiated Contact with Patient 05/31/14 1800     Chief Complaint  Patient presents with  . Shortness of Breath     (Consider location/radiation/quality/duration/timing/severity/associated sxs/prior Treatment) Patient is a 59 y.o. female presenting with shortness of breath. The history is provided by the patient.  Shortness of Breath Severity:  Moderate Onset quality:  Gradual Duration:  3 days Timing:  Constant Progression:  Worsening Chronicity:  New Context comment:  Patient hand surgery where she had a hysterectomy and bladder tacking done 2 weeks ago. Within the last 2-3 days patient has noticed significant shortness of breath Relieved by:  Rest and sitting up Worsened by:  Exertion (Lying down) Ineffective treatments:  None tried Associated symptoms: no abdominal pain, no chest pain, no cough, no fever and no sputum production   Associated symptoms comment:  Mild pain in the right calf. No lower extremity edema. Patient states she feels like she's suffocating at night when she lays down flat. She is now using 3 pillows to sleep. Also has noted some shortness of breath with walking. States that she is healing well from her surgery and her pain was almost gone. She was only hospitalized for 1 day however she has not been very active since surgery because of initial pain. Risk factors: recent surgery   Risk factors: no hx of PE/DVT, no oral contraceptive use and no prolonged immobilization     Past Medical History  Diagnosis Date  . SVD (spontaneous vaginal delivery)     x 3  . Depression     no meds  . Anxiety     no meds  . Arthritis     hands, lower back - no meds   Past Surgical History  Procedure Laterality Date  . Right leg surgery    . Leg surgery      metal plate with screws  . Tubal ligation    . Wisdom tooth extraction    . Vaginal hysterectomy N/A 05/16/2014    Procedure: TOTAL VAGINAL  HYSTECTOMY ;  Surgeon: Kirkland Hun, MD;  Location: [facility];  Service: Gynecology;  Laterality: N/A;  . Anterior and posterior repair N/A 05/16/2014    Procedure: ANTERIOR (CYSTOCELE) AND POSTERIOR REPAIR (RECTOCELE);  Surgeon: Kirkland Hun, MD;  Location: [facility];  Service: Gynecology;  Laterality: N/A;  Uvaldo Bristle procedure N/A 05/16/2014    Procedure: BURCH PROCEDURE;  Surgeon: Kirkland Hun, MD;  Location: [facility];  Service: Gynecology;  Laterality: N/A;  . Ear cyst excision N/A 05/16/2014    Procedure: INCLUSION CYST REMOVAL;  Surgeon: Kirkland Hun, MD;  Location: [facility];  Service: Gynecology;  Laterality: N/A;  . Abdominal hysterectomy     No family history on file. History  Substance Use Topics  . Smoking status: Never Smoker   . Smokeless tobacco: Never Used  . Alcohol Use: No   OB History    No data available     Review of Systems  Constitutional: Negative for fever.  Respiratory: Positive for shortness of breath. Negative for cough and sputum production.   Cardiovascular: Negative for chest pain.  Gastrointestinal: Negative for abdominal pain.  All other systems reviewed and are negative.     Allergies  Tramadol; Oxycodone; and Penicillins  Home Medications   Prior to Admission medications   Medication Sig Start Date End Date Taking? Authorizing Provider  famotidine (PEPCID) 20 MG tablet Take 1 tablet (  20 mg total) by mouth at bedtime as needed for heartburn. 05/17/14  Yes Henreitta LeberElmira Powell, PA-C  Throat Lozenges (LOZENGES MT) Use as directed 1 lozenge in the mouth or throat every 2 (two) hours as needed (sore throat).   Yes Historical Provider, MD  estradiol (ESTRACE VAGINAL) 0.1 MG/GM vaginal cream Place 0.25 Applicatorfuls vaginally at bedtime. 05/17/14 05/27/16  Kirkland HunArthur Stringer, MD  HYDROmorphone (DILAUDID) 2 MG tablet Take 1 tablet (2 mg total) by mouth every 6 (six) hours as needed for moderate pain or severe pain. 05/17/14   Henreitta LeberElmira Powell, PA-C  ibuprofen  (ADVIL,MOTRIN) 600 MG tablet 1 PO PC every 6 hours for 5 days then prn-pain Patient not taking: Reported on 05/31/2014 05/17/14   Henreitta LeberElmira Powell, PA-C  ondansetron (ZOFRAN) 4 MG tablet Take 1 tablet (4 mg total) by mouth every 8 (eight) hours as needed for nausea or vomiting. 05/22/14   Venus Standard, CNM   BP 129/77 mmHg  Pulse 79  Temp(Src) 98.1 F (36.7 C) (Oral)  Resp 18  SpO2 100% Physical Exam  Constitutional: She is oriented to person, place, and time. She appears well-developed and well-nourished.  HENT:  Head: Normocephalic and atraumatic.  Eyes: EOM are normal. Pupils are equal, round, and reactive to light.  Cardiovascular: Normal rate, regular rhythm, normal heart sounds and intact distal pulses.  Exam reveals no friction rub.   No murmur heard. Pulmonary/Chest: Effort normal. She has no wheezes. She has rales in the left lower field.  Abdominal: Soft. Bowel sounds are normal. She exhibits no distension. There is no tenderness. There is no rebound and no guarding.  Musculoskeletal: Normal range of motion. She exhibits tenderness.  Mild tenderness in the right calf. No edema  Neurological: She is alert and oriented to person, place, and time. No cranial nerve deficit.  Skin: Skin is warm and dry. No rash noted.  Psychiatric: She has a normal mood and affect. Her behavior is normal.  Nursing note and vitals reviewed.   ED Course  Procedures (including critical care time) Labs Review Labs Reviewed  COMPREHENSIVE METABOLIC PANEL - Abnormal; Notable for the following:    Glucose, Bld 103 (*)    All other components within normal limits  CBC WITH DIFFERENTIAL/PLATELET  BRAIN NATRIURETIC PEPTIDE    Imaging Review Dg Chest 2 View  05/31/2014   CLINICAL DATA:  Initial encounter for 2 day history of shortness of breath  EXAM: CHEST  2 VIEW  COMPARISON:  10/06/2011.  FINDINGS: The lungs are clear without focal infiltrate, edema, pneumothorax or pleural effusion. Hyper expansion  is consistent with emphysema. The cardiopericardial silhouette is within normal limits for size. Bones are diffusely demineralized. Telemetry leads overlie the chest.  IMPRESSION: Hyperexpansion without acute cardiopulmonary findings.   Electronically Signed   By: Kennith CenterEric  Mansell M.D.   On: 05/31/2014 18:45   Ct Angio Chest Pe W/cm &/or Wo Cm  05/31/2014   CLINICAL DATA:  SOB when laying down. Reports having hysterectomy x 2 weeks ago. Was sent here by her PCP for lab work and CXR. Pt denies any pain at this time  EXAM: CT ANGIOGRAPHY CHEST WITH CONTRAST  TECHNIQUE: Multidetector CT imaging of the chest was performed using the standard protocol during bolus administration of intravenous contrast. Multiplanar CT image reconstructions and MIPs were obtained to evaluate the vascular anatomy.  CONTRAST:  100mL OMNIPAQUE IOHEXOL 350 MG/ML SOLN  COMPARISON:  None.  FINDINGS: Right arm contrast injection. SVC patent. Borderline enlargement of central pulmonary arteries. Satisfactory  opacification of pulmonary arteries noted, and there is no evidence of pulmonary emboli. Patent superior and inferior pulmonary veins bilaterally. Mild left atrial enlargement. Adequate contrast opacification of the thoracic aorta with no evidence of dissection, aneurysm, or stenosis. There is classic 3 vessel brachiocephalic arch anatomy without proximal stenosis. Scattered plaque in the descending aorta.  No pleural or pericardial effusion. No hilar or mediastinal adenopathy. Calcified granulomas in the right upper lobe. 6 mm subpleural noncalcified nodule, right middle lobe, image 57/10. Left lung clear. Thoracic spine and sternum intact. Visualized portions of upper abdomen unremarkable.  Review of the MIP images confirms the above findings.  IMPRESSION: 1. Negative for acute PE or thoracic aortic dissection. 2. 6 mm right middle lobe pulmonary nodule, nonspecific. If the patient is at high risk for bronchogenic carcinoma, follow-up chest  CT at 6-12 months is recommended. If the patient is at low risk for bronchogenic carcinoma, follow-up chest CT at 12 months is recommended. This recommendation follows the consensus statement: Guidelines for Management of Small Pulmonary Nodules Detected on CT Scans: A Statement from the Fleischner Society as published in Radiology 2005;237:395-400.   Electronically Signed   By: Corlis Leak M.D.   On: 05/31/2014 20:30     EKG Interpretation   Date/Time:  Wednesday May 31 2014 18:04:17 EDT Ventricular Rate:  76 PR Interval:  152 QRS Duration: 94 QT Interval:  382 QTC Calculation: 429 R Axis:   66 Text Interpretation:  Sinus rhythm Baseline wander in lead(s) V1 No  significant change since last tracing Confirmed by Anitra Lauth  MD, Alphonzo Lemmings  [phone]) on 05/31/2014 6:40:19 PM      MDM   Final diagnoses:  SOB (shortness of breath)    Patient presenting with several days of orthopnea and shortness of breath with exertion. Patient had a hysterectomy with bladder tacking done 2 weeks ago. She has noted some mild right lower calf pain in the last few days as well but denies any lower extremity swelling. No history of hypertension, CHF or infectious symptoms such as cough.  On exam patient has minimal rales in the left lower lobe but otherwise normal exam except her right calf tenderness. No notable swelling. Concern for fluid overload from recent surgery versus PE.  EKG without acute findings. CBC, BMP, CMP, chest x-ray pending all these are normal to a CT of the chest to rule out PE  9:00 PM Patient's imaging including CTA to rule out PE is negative. No signs of CHF, anemia or renal insufficiency. Patient is complaining of some allergy symptoms which could be the cause of her symptoms however encouraged her to follow-up with her PCP  Gwyneth Sprout, MD 05/31/14 6045  Gwyneth Sprout, MD 05/31/14 2103

## 2014-05-31 NOTE — Discharge Instructions (Signed)
Shortness of Breath °Shortness of breath means you have trouble breathing. Shortness of breath needs medical care right away. °HOME CARE  °· Do not smoke. °· Avoid being around chemicals or things (paint fumes, dust) that may bother your breathing. °· Rest as needed. Slowly begin your normal activities. °· Only take medicines as told by your doctor. °· Keep all doctor visits as told. °GET HELP RIGHT AWAY IF:  °· Your shortness of breath gets worse. °· You feel lightheaded, pass out (faint), or have a cough that is not helped by medicine. °· You cough up blood. °· You have pain with breathing. °· You have pain in your chest, arms, shoulders, or belly (abdomen). °· You have a fever. °· You cannot walk up stairs or exercise the way you normally do. °· You do not get better in the time expected. °· You have a hard time doing normal activities even with rest. °· You have problems with your medicines. °· You have any new symptoms. °MAKE SURE YOU: °· Understand these instructions. °· Will watch your condition. °· Will get help right away if you are not doing well or get worse. °Document Released: 08/06/2007 Document Revised: 02/22/2013 Document Reviewed: 05/05/2011 °Exit[employer]® Patient Information ©2015 [employer]. This information is not intended to replace advice given to you by your health care provider. Make sure you discuss any questions you have with your health care provider. ° °

## 2014-05-31 NOTE — ED Notes (Signed)
Pt reports SOB when laying down.  Reports having hysterectomy x 2 weeks ago.  Was sent here by her PCP for lab work and CXR.  Pt denies any pain at this time

## 2014-05-31 NOTE — ED Notes (Signed)
Pt complaining of nausea but does not want to take any medications for symptoms.

## 2015-03-19 ENCOUNTER — Other Ambulatory Visit: Payer: Self-pay | Admitting: Physical Medicine and Rehabilitation

## 2015-03-19 DIAGNOSIS — N959 Unspecified menopausal and perimenopausal disorder: Secondary | ICD-10-CM

## 2015-03-27 ENCOUNTER — Other Ambulatory Visit: Payer: Self-pay | Admitting: Family Medicine

## 2015-03-27 ENCOUNTER — Ambulatory Visit
Admission: RE | Admit: 2015-03-27 | Discharge: 2015-03-27 | Disposition: A | Discharge status: Home / Self Care | Payer: BLUE CROSS/BLUE SHIELD | Source: Ambulatory Visit | Attending: Family Medicine | Admitting: Family Medicine

## 2015-03-27 DIAGNOSIS — R059 Cough, unspecified: Secondary | ICD-10-CM

## 2015-03-27 DIAGNOSIS — R05 Cough: Secondary | ICD-10-CM

## 2015-04-12 ENCOUNTER — Other Ambulatory Visit: Payer: BLUE CROSS/BLUE SHIELD

## 2015-05-11 ENCOUNTER — Other Ambulatory Visit: Payer: Self-pay | Admitting: Obstetrics and Gynecology

## 2015-05-11 DIAGNOSIS — N6459 Other signs and symptoms in breast: Secondary | ICD-10-CM

## 2015-05-17 ENCOUNTER — Ambulatory Visit
Admission: RE | Admit: 2015-05-17 | Discharge: 2015-05-17 | Disposition: A | Discharge status: Home / Self Care | Payer: BLUE CROSS/BLUE SHIELD | Source: Ambulatory Visit | Attending: Obstetrics and Gynecology | Admitting: Obstetrics and Gynecology

## 2015-05-17 ENCOUNTER — Other Ambulatory Visit: Payer: Self-pay | Admitting: Obstetrics and Gynecology

## 2015-05-17 DIAGNOSIS — N6459 Other signs and symptoms in breast: Secondary | ICD-10-CM

## 2015-05-17 DIAGNOSIS — N6310 Unspecified lump in the right breast, unspecified quadrant: Secondary | ICD-10-CM

## 2015-12-25 ENCOUNTER — Other Ambulatory Visit (HOSPITAL_COMMUNITY): Payer: Self-pay | Admitting: Family Medicine

## 2015-12-25 DIAGNOSIS — T17920A Food in respiratory tract, part unspecified causing asphyxiation, initial encounter: Secondary | ICD-10-CM

## 2016-01-02 ENCOUNTER — Ambulatory Visit (HOSPITAL_COMMUNITY)
Admission: RE | Admit: 2016-01-02 | Discharge: 2016-01-02 | Disposition: A | Discharge status: Home / Self Care | Payer: BLUE CROSS/BLUE SHIELD | Source: Ambulatory Visit | Attending: Family Medicine | Admitting: Family Medicine

## 2016-01-02 DIAGNOSIS — T17920A Food in respiratory tract, part unspecified causing asphyxiation, initial encounter: Secondary | ICD-10-CM | POA: Insufficient documentation

## 2016-01-02 DIAGNOSIS — X58XXXA Exposure to other specified factors, initial encounter: Secondary | ICD-10-CM | POA: Diagnosis not present

## 2016-01-25 IMAGING — CR DG CHEST 2V
2 series · 2 of 2 positions shown · non-contrast
Comparison: 10/06/2011.

CLINICAL DATA: Initial encounter for 2 day history of shortness of
breath

EXAM:
CHEST  2 VIEW

[w chest pa]
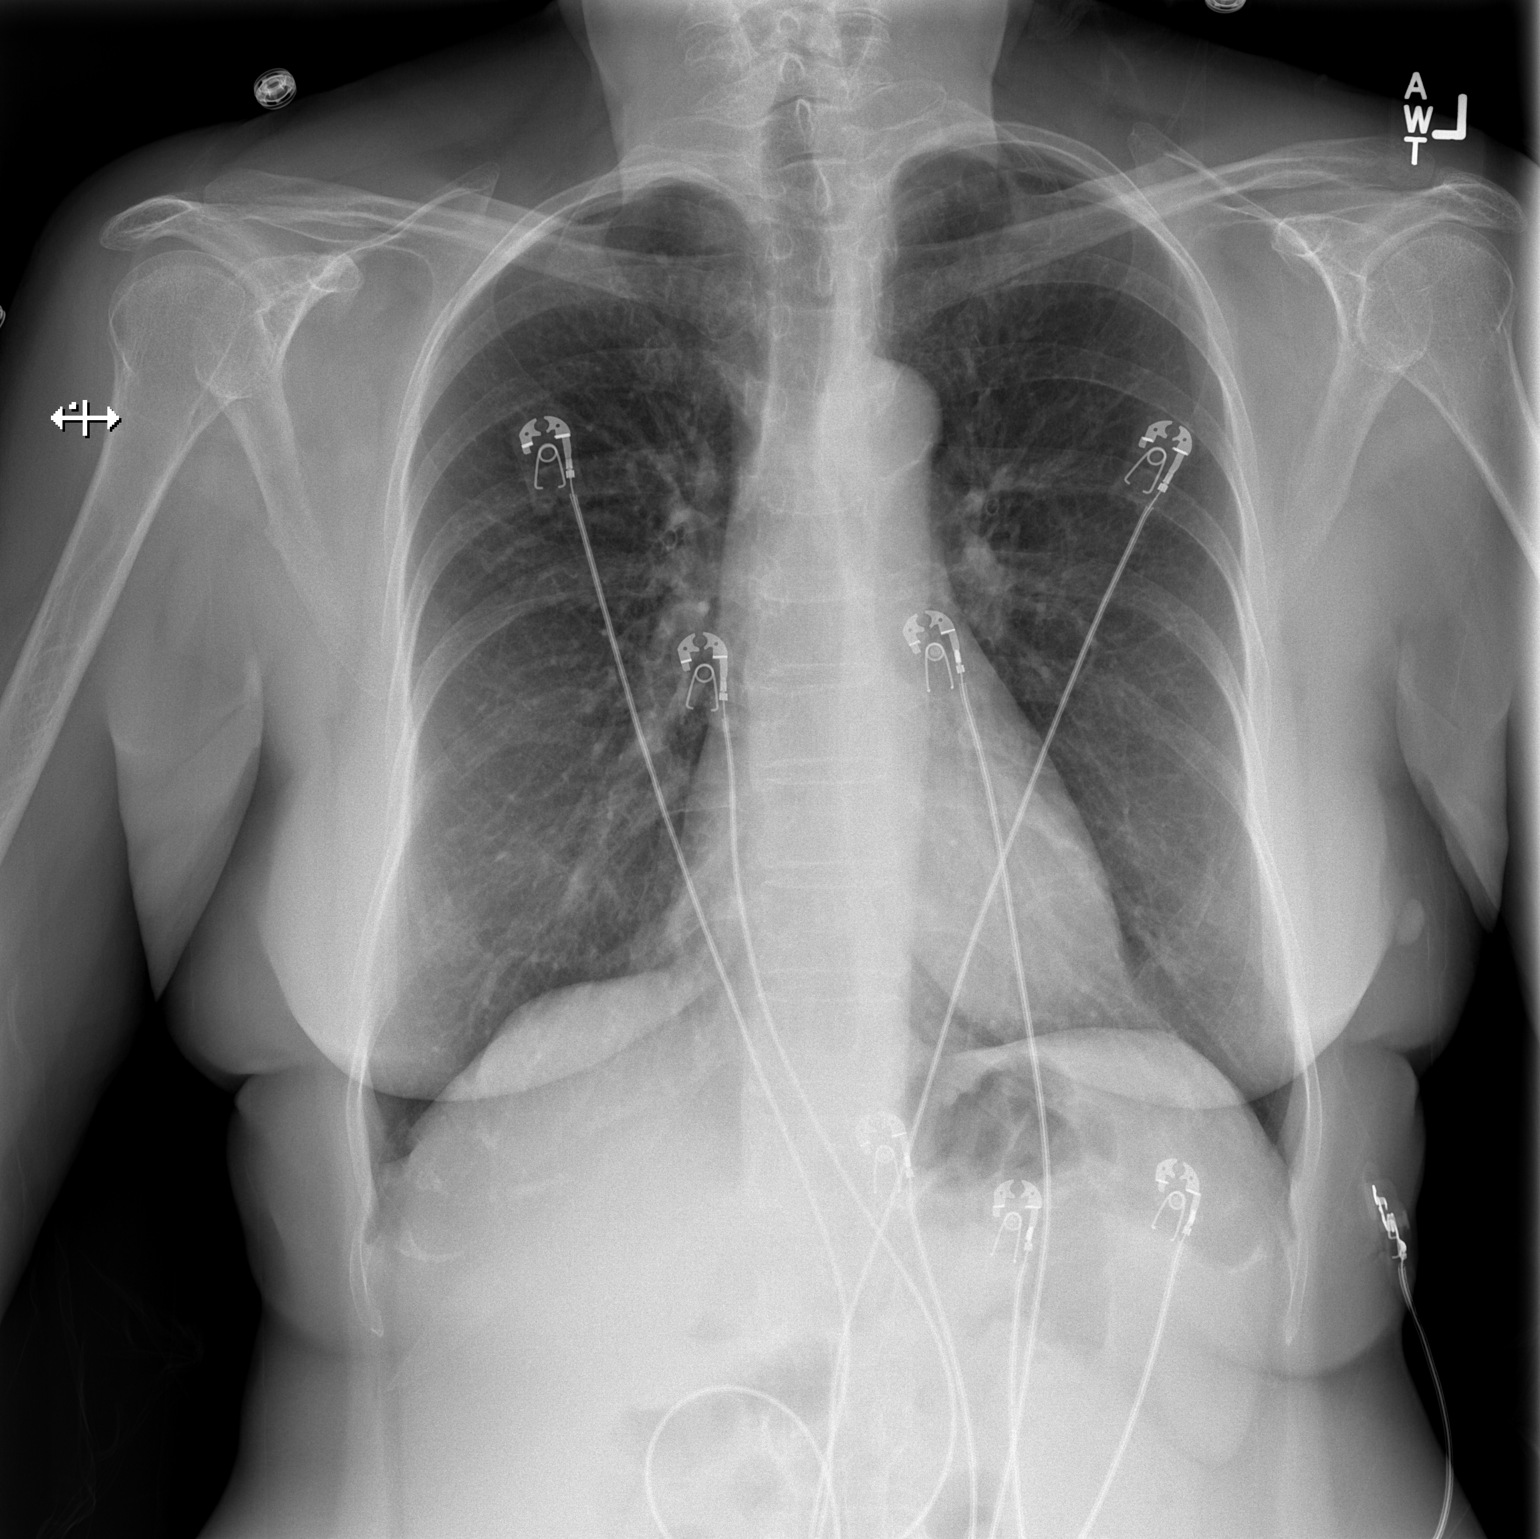

[w chest lat]
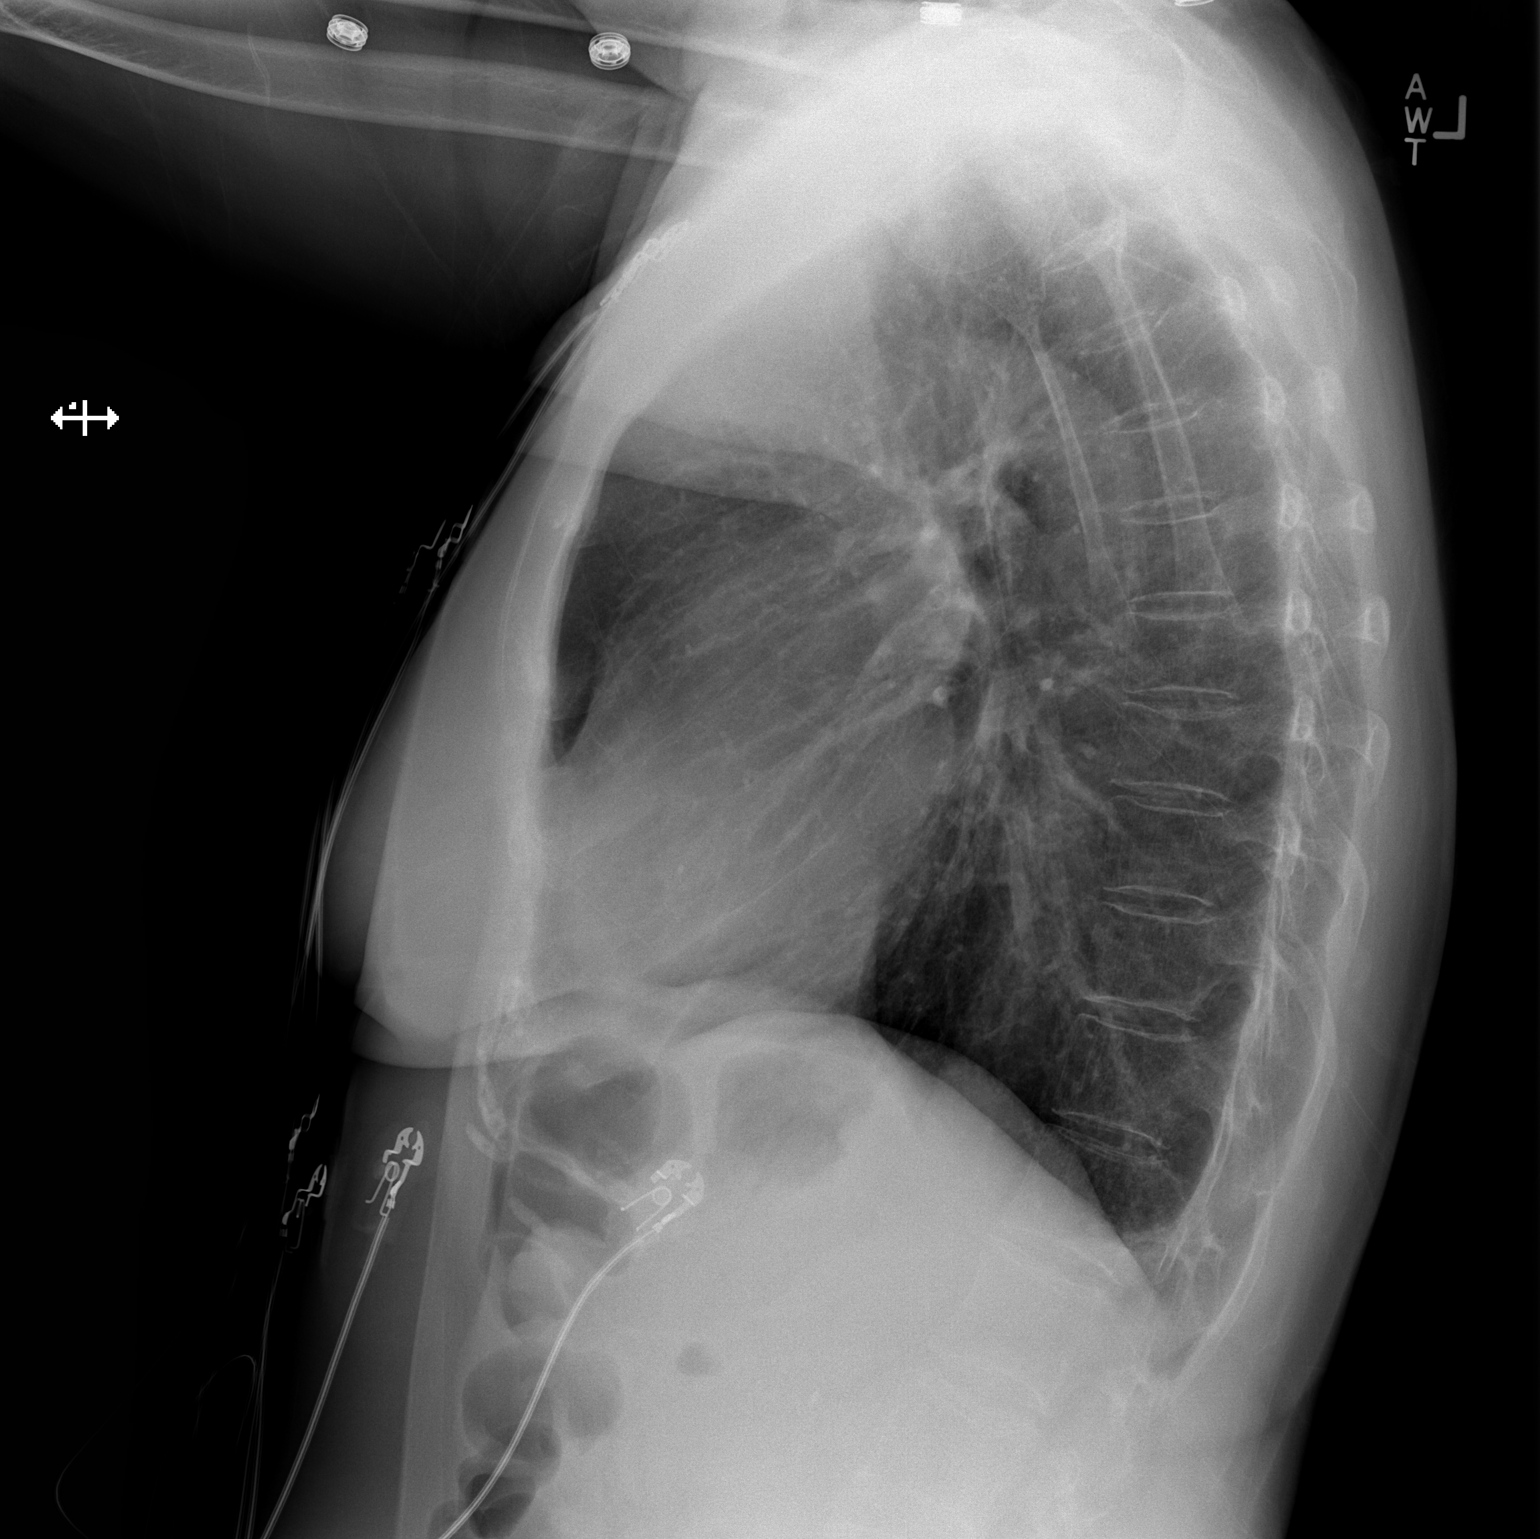

[2 of 2 positions shown; findings below may reference images not displayed]

FINDINGS: The lungs are clear without focal infiltrate, edema, pneumothorax or
pleural effusion. Hyper expansion is consistent with emphysema. The
cardiopericardial silhouette is within normal limits for size. Bones
are diffusely demineralized. Telemetry leads overlie the chest.
IMPRESSION: Hyperexpansion without acute cardiopulmonary findings.

## 2017-02-03 ENCOUNTER — Other Ambulatory Visit: Payer: Self-pay

## 2017-02-03 ENCOUNTER — Encounter (HOSPITAL_COMMUNITY): Payer: Self-pay | Admitting: *Deleted

## 2017-02-03 ENCOUNTER — Emergency Department (HOSPITAL_COMMUNITY)
Admission: EM | Admit: 2017-02-03 | Discharge: 2017-02-03 | Disposition: A | Discharge status: Home / Self Care | Payer: BLUE CROSS/BLUE SHIELD | Attending: Emergency Medicine | Admitting: Emergency Medicine

## 2017-02-03 DIAGNOSIS — Z5321 Procedure and treatment not carried out due to patient leaving prior to being seen by health care provider: Secondary | ICD-10-CM | POA: Insufficient documentation

## 2017-02-03 DIAGNOSIS — M545 Low back pain: Secondary | ICD-10-CM | POA: Insufficient documentation

## 2017-02-03 MED ORDER — FENTANYL CITRATE (PF) 100 MCG/2ML IJ SOLN
50.0000 ug | INTRAMUSCULAR | Status: DC | PRN
  Administered 2017-02-03: 50 ug via NASAL
  Filled 2017-02-03: qty 2

## 2017-02-03 NOTE — ED Triage Notes (Signed)
Pt reports hx of osteoporosis, turned the wrong way yesterday and having severe lower back pain since. Has numbness sensation to both legs and difficulty ambulating.

## 2017-02-03 NOTE — ED Notes (Signed)
Pt to nurse first stating she is tired of waiting in the chair and she is leaving at this time. Wrist band removed and pt leaving department with SO

## 2017-02-04 ENCOUNTER — Other Ambulatory Visit: Payer: Self-pay

## 2017-02-04 ENCOUNTER — Emergency Department (HOSPITAL_COMMUNITY)
Admission: EM | Admit: 2017-02-04 | Discharge: 2017-02-05 | Disposition: A | Discharge status: Home / Self Care | Payer: Self-pay | Attending: Emergency Medicine | Admitting: Emergency Medicine

## 2017-02-04 ENCOUNTER — Encounter (HOSPITAL_COMMUNITY): Payer: Self-pay | Admitting: Emergency Medicine

## 2017-02-04 DIAGNOSIS — Y9389 Activity, other specified: Secondary | ICD-10-CM | POA: Insufficient documentation

## 2017-02-04 DIAGNOSIS — Y999 Unspecified external cause status: Secondary | ICD-10-CM | POA: Insufficient documentation

## 2017-02-04 DIAGNOSIS — X500XXA Overexertion from strenuous movement or load, initial encounter: Secondary | ICD-10-CM | POA: Insufficient documentation

## 2017-02-04 DIAGNOSIS — Y929 Unspecified place or not applicable: Secondary | ICD-10-CM | POA: Insufficient documentation

## 2017-02-04 DIAGNOSIS — S32010A Wedge compression fracture of first lumbar vertebra, initial encounter for closed fracture: Secondary | ICD-10-CM | POA: Insufficient documentation

## 2017-02-04 DIAGNOSIS — S22000A Wedge compression fracture of unspecified thoracic vertebra, initial encounter for closed fracture: Secondary | ICD-10-CM | POA: Insufficient documentation

## 2017-02-04 MED ORDER — HYDROCODONE-ACETAMINOPHEN 5-325 MG PO TABS
1.0000 | ORAL_TABLET | Freq: Once | ORAL | Status: AC
  Administered 2017-02-04: 1 via ORAL
  Filled 2017-02-04: qty 1

## 2017-02-04 NOTE — ED Triage Notes (Signed)
Pt to ER for lower back pain, was here yesterday and left AMA. States has numbness sensation to both legs and has trouble with walking. Tearful in triage.

## 2017-02-05 ENCOUNTER — Emergency Department (HOSPITAL_COMMUNITY): Payer: Self-pay

## 2017-02-05 LAB — CBC WITH DIFFERENTIAL/PLATELET
Basophils Absolute: 0 10*3/uL (ref 0.0–0.1)
Basophils Relative: 0 %
EOS PCT: 4 %
Eosinophils Absolute: 0.2 10*3/uL (ref 0.0–0.7)
HCT: 38.3 % (ref 36.0–46.0)
Hemoglobin: 12.7 g/dL (ref 12.0–15.0)
LYMPHS PCT: 28 %
Lymphs Abs: 1.3 10*3/uL (ref 0.7–4.0)
MCH: 27 pg (ref 26.0–34.0)
MCHC: 33.2 g/dL (ref 30.0–36.0)
MCV: 81.5 fL (ref 78.0–100.0)
MONOS PCT: 8 %
Monocytes Absolute: 0.4 10*3/uL (ref 0.1–1.0)
NEUTROS ABS: 2.8 10*3/uL (ref 1.7–7.7)
Neutrophils Relative %: 60 %
PLATELETS: 222 10*3/uL (ref 150–400)
RBC: 4.7 MIL/uL (ref 3.87–5.11)
RDW: 14.6 % (ref 11.5–15.5)
WBC: 4.7 10*3/uL (ref 4.0–10.5)

## 2017-02-05 LAB — BASIC METABOLIC PANEL
Anion gap: 7 (ref 5–15)
BUN: 10 mg/dL (ref 6–20)
CHLORIDE: 107 mmol/L (ref 101–111)
CO2: 25 mmol/L (ref 22–32)
Calcium: 9.3 mg/dL (ref 8.9–10.3)
Creatinine, Ser: 0.57 mg/dL (ref 0.44–1.00)
GFR calc Af Amer: >60 mL/min (ref >60)
GLUCOSE: 97 mg/dL (ref 65–99)
Potassium: 3.5 mmol/L (ref 3.5–5.1)
Sodium: 139 mmol/L (ref 135–145)

## 2017-02-05 MED ORDER — GADOBENATE DIMEGLUMINE 529 MG/ML IV SOLN
10.0000 mL | Freq: Once | INTRAVENOUS | Status: AC | PRN
  Administered 2017-02-05: 10 mL via INTRAVENOUS

## 2017-02-05 MED ORDER — MORPHINE SULFATE (PF) 4 MG/ML IV SOLN
4.0000 mg | Freq: Once | INTRAVENOUS | Status: AC
  Administered 2017-02-05: 4 mg via INTRAVENOUS
  Filled 2017-02-05: qty 1

## 2017-02-05 MED ORDER — OXYCODONE-ACETAMINOPHEN 5-325 MG PO TABS
2.0000 | ORAL_TABLET | Freq: Four times a day (QID) | ORAL | 0 refills | Status: DC | PRN
Start: 2017-02-05 — End: 2023-06-29

## 2017-02-05 MED ORDER — DOCUSATE SODIUM 100 MG PO CAPS: 100.0000 mg | ORAL_CAPSULE | Freq: Two times a day (BID) | ORAL | 0 refills | Status: DC

## 2017-02-05 MED ORDER — GI COCKTAIL ~~LOC~~
30.0000 mL | Freq: Once | ORAL | Status: AC
  Administered 2017-02-05: 30 mL via ORAL
  Filled 2017-02-05: qty 30

## 2017-02-05 MED ORDER — ONDANSETRON 4 MG PO TBDP: 4.0000 mg | ORAL_TABLET | Freq: Four times a day (QID) | ORAL | 0 refills | Status: DC | PRN

## 2017-02-05 MED ORDER — ONDANSETRON HCL 4 MG/2ML IJ SOLN
4.0000 mg | Freq: Once | INTRAMUSCULAR | Status: AC
  Administered 2017-02-05: 4 mg via INTRAVENOUS
  Filled 2017-02-05: qty 2

## 2017-02-05 NOTE — ED Notes (Signed)
Pt up to ambulate with this RN. Pt ambulated without difficulties.

## 2017-02-05 NOTE — ED Notes (Signed)
Secretary paged ortho tech x2

## 2017-02-05 NOTE — ED Notes (Signed)
Patient transported to MRI 

## 2017-02-05 NOTE — ED Notes (Signed)
Secretary paged ortho tech x3

## 2017-02-05 NOTE — ED Provider Notes (Signed)
TIME SEEN: 1:31 AM  CHIEF COMPLAINT: Back pain  HPI: Patient is a 61 year old female with history of anxiety, arthritis who presents to the emergency department with complaints of mid and lower back pain for the past 2 months.  States that she injured her back when she picked up a box approximately 2 months ago.  States pain has been progressively getting worse.  Now it is so severe that she is unable to walk without significant pain.  She is now having intermittent saddle anesthesia mostly with standing and had an episode of urinary incontinence yesterday.  No bowel incontinence.  No urinary retention.  Reports that she has had subjective fevers and chills.  She denies any history of IV drug abuse, back surgeries or epidural injections.  Her PCP is Dr. Jeannetta NapElkins and she has not called him about the symptoms.  She has been taking ibuprofen without any relief.  ROS: See HPI Constitutional: Subjective fever  Eyes: no drainage  ENT: no runny nose   Cardiovascular:  no chest pain  Resp: no SOB  GI: no vomiting GU: no dysuria Integumentary: no rash  Allergy: no hives  Musculoskeletal: no leg swelling  Neurological: no slurred speech ROS otherwise negative  PAST MEDICAL HISTORY/PAST SURGICAL HISTORY:  Past Medical History:  Diagnosis Date  . Anxiety    no meds  . Arthritis    hands, lower back - no meds  . Depression    no meds  . SVD (spontaneous vaginal delivery)    x 3    MEDICATIONS:  Prior to Admission medications   Medication Sig Start Date End Date Taking? Authorizing Provider  Cholecalciferol (VITAMIN D-3) 1000 units CAPS Take 1,000 Units by mouth daily.   Yes [provider]  ibuprofen (ADVIL,MOTRIN) 800 MG tablet Take 800 mg by mouth every 8 (eight) hours as needed (for pain).   Yes [provider]  famotidine (PEPCID) 20 MG tablet Take 1 tablet (20 mg total) by mouth at bedtime as needed for heartburn. Patient not taking: Reported on 02/04/2017 05/17/14    Henreitta LeberPowell, [place], PA-C  HYDROmorphone (DILAUDID) 2 MG tablet Take 1 tablet (2 mg total) by mouth every 6 (six) hours as needed for moderate pain or severe pain. Patient not taking: Reported on 02/04/2017 05/17/14   Henreitta LeberPowell, [place], PA-C  ibuprofen (ADVIL,MOTRIN) 600 MG tablet 1 PO PC every 6 hours for 5 days then prn-pain Patient not taking: Reported on 02/04/2017 05/17/14   Henreitta LeberPowell, [place], PA-C  ondansetron (ZOFRAN) 4 MG tablet Take 1 tablet (4 mg total) by mouth every 8 (eight) hours as needed for nausea or vomiting. Patient not taking: Reported on 02/04/2017 05/22/14   Standard, Venus, CNM    ALLERGIES:  Allergies  Allergen Reactions  . Tramadol Itching  . Oxycodone Rash    "Patient is allergic to narcotic pain medication"  . Penicillins Rash    Has patient had a PCN reaction causing immediate rash, facial/tongue/throat swelling, SOB or lightheadedness with hypotension: Yes Has patient had a PCN reaction causing severe rash involving mucus membranes or skin necrosis: No Has patient had a PCN reaction that required hospitalization: No Has patient had a PCN reaction occurring within the last 10 years: No If all of the above answers are "NO", then may proceed with Cephalosporin use.     SOCIAL HISTORY:  Social History   Tobacco Use  . Smoking status: Never Smoker  . Smokeless tobacco: Never Used  Substance Use Topics  . Alcohol use: No  FAMILY HISTORY: History reviewed. No pertinent family history.  EXAM: BP 130/76 (BP Location: Left Arm)   Pulse 88   Temp 97.9 F (36.6 C) (Oral)   Resp 20   SpO2 97%  CONSTITUTIONAL: Alert and oriented and responds appropriately to questions.  He was very uncomfortable, tearful HEAD: Normocephalic EYES: Conjunctivae clear, pupils appear equal, EOMI ENT: normal nose; moist mucous membranes NECK: Supple, no meningismus, no nuchal rigidity, no LAD  CARD: RRR; S1 and S2 appreciated; no murmurs, no clicks, no rubs, no gallops RESP: Normal chest  excursion without splinting or tachypnea; breath sounds clear and equal bilaterally; no wheezes, no rhonchi, no rales, no hypoxia or respiratory distress, speaking full sentences ABD/GI: Normal bowel sounds; non-distended; soft, non-tender, no rebound, no guarding, no peritoneal signs, no hepatosplenomegaly BACK:  The back appears normal and is tender throughout to the thoracic and lumbar spine as well as the paraspinal muscles without erythema, warmth or ecchymosis.  No lesions noted.  There is no midline step-off or deformity. EXT: Normal ROM in all joints; non-tender to palpation; no edema; normal capillary refill; no cyanosis, no calf tenderness or swelling    SKIN: Normal color for age and race; warm; no rash NEURO: Moves all extremities equally, currently no numbness or tingling, 2+ deep tendon reflexes in bilateral upper and lower extremities,no clonus, no saddle anesthesia, unable to ambulate secondary to pain, strength is 5-/5 in all 4 extremities PSYCH: The patient's mood and manner are appropriate. Grooming and personal hygiene are appropriate.  MEDICAL DECISION MAKING: Patient here with complaints of intermittent saddle anesthesia and urinary incontinence with back pain.  Also reports subjective fevers and chills.  Afebrile here.  She does state that she has been taking ibuprofen regularly though.  Will check labs and obtain MRI of her thoracic and lumbar spine with and without contrast to evaluate for possible cauda equina, spinal stenosis, epidural abscess or hematoma, discitis, osteomyelitis.  Will give morphine for pain control.  ED PROGRESS: Patient reports significant improvement in pain after morphine but now pain is returning after MRIs.  We are waiting on these results.  Labs are unremarkable.  No leukocytosis.  Will give second dose of morphine and reassess.    MRI show the patient has acute appearing T11 superior endplate fracture with 20% anterior loss of height, T12 anterior  compression deformity with 40% loss of height, L1 compression deformity with 30% central loss of height.  She has no abnormal cord signal or enhancement.  No cord compression.  No cauda equina.  No sign of infection.  We will place her in a TLSO brace and have her follow-up as an outpatient.   7:25 AM  Pt's TLSO brace is in place and she states that this actually provides her significant comfort.  She has been able to ambulate without assistance here.  I feel she is safe to go home and follow-up with her PCP as an outpatient.  Will discharge with prescription of Percocet.  She states that she can take oxycodone as long as she takes Benadryl with it at home.  It does not cause a rash only mild itching.  No anaphylactic reaction.  Discussed at length return precautions.  Patient is comfortable with this plan.  Her husband will drive her home.    At this time, I do not feel there is any life-threatening condition present. I have reviewed and discussed all results (EKG, imaging, lab, urine as appropriate) and exam findings with patient/family. I have reviewed  nursing notes and appropriate previous records.  I feel the patient is safe to be discharged home without further emergent workup and can continue workup as an outpatient as needed. Discussed usual and customary return precautions. Patient/family verbalize understanding and are comfortable with this plan.  Outpatient follow-up has been provided if needed. All questions have been answered.     Darcy Cordner, Layla Maw, DO 02/05/17 [phone]

## 2017-03-30 ENCOUNTER — Ambulatory Visit
Admission: RE | Admit: 2017-03-30 | Discharge: 2017-03-30 | Disposition: A | Discharge status: Home / Self Care | Payer: BLUE CROSS/BLUE SHIELD | Source: Ambulatory Visit | Attending: Family Medicine | Admitting: Family Medicine

## 2017-03-30 ENCOUNTER — Other Ambulatory Visit: Payer: Self-pay | Admitting: Family Medicine

## 2017-03-30 DIAGNOSIS — R062 Wheezing: Secondary | ICD-10-CM

## 2017-08-04 ENCOUNTER — Ambulatory Visit
Admission: RE | Admit: 2017-08-04 | Discharge: 2017-08-04 | Disposition: A | Discharge status: Home / Self Care | Payer: BLUE CROSS/BLUE SHIELD | Source: Ambulatory Visit | Attending: Family Medicine | Admitting: Family Medicine

## 2017-08-04 ENCOUNTER — Other Ambulatory Visit: Payer: Self-pay | Admitting: Family Medicine

## 2017-08-04 DIAGNOSIS — R6889 Other general symptoms and signs: Secondary | ICD-10-CM

## 2017-09-21 ENCOUNTER — Other Ambulatory Visit: Payer: Self-pay | Admitting: Family Medicine

## 2017-09-21 DIAGNOSIS — R1310 Dysphagia, unspecified: Secondary | ICD-10-CM

## 2017-09-29 ENCOUNTER — Ambulatory Visit
Admission: RE | Admit: 2017-09-29 | Discharge: 2017-09-29 | Disposition: A | Discharge status: Home / Self Care | Payer: BLUE CROSS/BLUE SHIELD | Source: Ambulatory Visit | Attending: Family Medicine | Admitting: Family Medicine

## 2017-09-29 DIAGNOSIS — R1310 Dysphagia, unspecified: Secondary | ICD-10-CM

## 2018-04-06 ENCOUNTER — Emergency Department (HOSPITAL_COMMUNITY): Payer: BLUE CROSS/BLUE SHIELD

## 2018-04-06 ENCOUNTER — Other Ambulatory Visit: Payer: Self-pay

## 2018-04-06 ENCOUNTER — Encounter (HOSPITAL_COMMUNITY): Payer: Self-pay

## 2018-04-06 ENCOUNTER — Emergency Department (HOSPITAL_COMMUNITY)
Admission: EM | Admit: 2018-04-06 | Discharge: 2018-04-06 | Disposition: A | Discharge status: Home / Self Care | Payer: BLUE CROSS/BLUE SHIELD | Attending: Emergency Medicine | Admitting: Emergency Medicine

## 2018-04-06 DIAGNOSIS — R002 Palpitations: Secondary | ICD-10-CM | POA: Diagnosis not present

## 2018-04-06 DIAGNOSIS — R112 Nausea with vomiting, unspecified: Secondary | ICD-10-CM | POA: Insufficient documentation

## 2018-04-06 DIAGNOSIS — Z79899 Other long term (current) drug therapy: Secondary | ICD-10-CM | POA: Diagnosis not present

## 2018-04-06 DIAGNOSIS — R110 Nausea: Secondary | ICD-10-CM

## 2018-04-06 HISTORY — DX: Age-related osteoporosis without current pathological fracture: M81.0

## 2018-04-06 LAB — URINALYSIS, ROUTINE W REFLEX MICROSCOPIC
BILIRUBIN URINE: NEGATIVE
Glucose, UA: NEGATIVE mg/dL
Ketones, ur: 20 mg/dL — AB
Nitrite: NEGATIVE
Protein, ur: NEGATIVE mg/dL
Specific Gravity, Urine: 1.017 (ref 1.005–1.030)
pH: 5 (ref 5.0–8.0)

## 2018-04-06 LAB — I-STAT TROPONIN, ED
Troponin i, poc: 0 ng/mL (ref 0.00–0.08)
Troponin i, poc: 0 ng/mL (ref 0.00–0.08)

## 2018-04-06 LAB — CBC
HCT: 44.1 % (ref 36.0–46.0)
HEMOGLOBIN: 14 g/dL (ref 12.0–15.0)
MCH: 26.7 pg (ref 26.0–34.0)
MCHC: 31.7 g/dL (ref 30.0–36.0)
MCV: 84.2 fL (ref 80.0–100.0)
Platelets: 184 10*3/uL (ref 150–400)
RBC: 5.24 MIL/uL — AB (ref 3.87–5.11)
RDW: 13.5 % (ref 11.5–15.5)
WBC: 6.4 10*3/uL (ref 4.0–10.5)
nRBC: 0 % (ref 0.0–0.2)

## 2018-04-06 LAB — LACTIC ACID, PLASMA: Lactic Acid, Venous: 1 mmol/L (ref 0.5–1.9)

## 2018-04-06 LAB — COMPREHENSIVE METABOLIC PANEL
ALK PHOS: 66 U/L (ref 38–126)
ALT: 15 U/L (ref 0–44)
AST: 24 U/L (ref 15–41)
Albumin: 3.9 g/dL (ref 3.5–5.0)
Anion gap: 11 (ref 5–15)
BUN: 11 mg/dL (ref 8–23)
CO2: 22 mmol/L (ref 22–32)
Calcium: 9.3 mg/dL (ref 8.9–10.3)
Chloride: 107 mmol/L (ref 98–111)
Creatinine, Ser: 0.62 mg/dL (ref 0.44–1.00)
GFR calc Af Amer: >60 mL/min (ref >60)
GFR calc non Af Amer: >60 mL/min (ref >60)
Glucose, Bld: 117 mg/dL — ABNORMAL HIGH (ref 70–99)
Potassium: 4.4 mmol/L (ref 3.5–5.1)
SODIUM: 140 mmol/L (ref 135–145)
Total Bilirubin: 0.8 mg/dL (ref 0.3–1.2)
Total Protein: 7 g/dL (ref 6.5–8.1)

## 2018-04-06 LAB — LIPASE, BLOOD: Lipase: 40 U/L (ref 11–51)

## 2018-04-06 MED ORDER — ONDANSETRON 4 MG PO TBDP: 4.0000 mg | ORAL_TABLET | Freq: Three times a day (TID) | ORAL | 0 refills | Status: DC | PRN

## 2018-04-06 MED ORDER — SODIUM CHLORIDE 0.9 % IV BOLUS
500.0000 mL | Freq: Once | INTRAVENOUS | Status: AC
  Administered 2018-04-06: 500 mL via INTRAVENOUS

## 2018-04-06 MED ORDER — SODIUM CHLORIDE 0.9% FLUSH: 3.0000 mL | Freq: Once | INTRAVENOUS | Status: DC

## 2018-04-06 MED ORDER — ONDANSETRON 4 MG PO TBDP
4.0000 mg | ORAL_TABLET | Freq: Once | ORAL | Status: AC | PRN
  Administered 2018-04-06: 4 mg via ORAL
  Filled 2018-04-06: qty 1

## 2018-04-06 NOTE — Discharge Instructions (Addendum)
If your symptoms worsen or do not begin to resolve over the next two days please call your PCP or return to the ED.   You were prescribed Zofran 4mg . Please take this medication every 8 hours as needed for nausea or vomiting.

## 2018-04-06 NOTE — ED Triage Notes (Signed)
Pt report chest discomfort, pain in her back across her shoulders and nausea since last night. Dizziness, SOB as well.

## 2018-04-06 NOTE — ED Provider Notes (Signed)
[facility] EMERGENCY DEPARTMENT Provider Note   CSN: [id number] Arrival date & time: 04/06/18  1134     History   Chief Complaint Chief Complaint  Patient presents with  . Chest Pain  . Neck Pain  . Nausea    HPI SHEINA Barajas is a 63 y.o. female with PMH tachycardia, hysterectomy two years ago presenting with abdominal pain, nausea, and back pain across her shoulders that started yesterday. She states this is new and has not happened before. She vomited once overnight, which was yellow without blood or coffee ground in color and has not eaten today. She states she was recently fitted for a heart monitor due to palpitations and because her heart will sometimes increase to 150s to 200 randomly when walking or exercising. She states her heart was racing last night over 100. She is unsure of the results of the heart monitor at this time and is supposed to follow-up with cardiology. She denies recent illness. She has some mild intermittent chronic SOB. She has had some constipation recently but last BM was this am and was normal. She denies recent changes in medications. She does endorse mild dysuria.   HPI  Past Medical History:  Diagnosis Date  . Anxiety    no meds  . Arthritis    hands, lower back - no meds  . Depression    no meds  . Osteoporosis   . SVD (spontaneous vaginal delivery)    x 3    Patient Active Problem List   Diagnosis Date Noted  . Postoperative abdominal pain 05/17/2014  . Pelvic prolapse 05/16/2014    Past Surgical History:  Procedure Laterality Date  . ABDOMINAL HYSTERECTOMY    . ANTERIOR AND POSTERIOR REPAIR N/A 05/16/2014   Procedure: ANTERIOR (CYSTOCELE) AND POSTERIOR REPAIR (RECTOCELE);  Surgeon: Kirkland Hun, MD;  Location: [facility];  Service: Gynecology;  Laterality: N/A;  . BURCH PROCEDURE N/A 05/16/2014   Procedure: BURCH PROCEDURE;  Surgeon: Kirkland Hun, MD;  Location: [facility];  Service: Gynecology;  Laterality: N/A;  .  EAR CYST EXCISION N/A 05/16/2014   Procedure: INCLUSION CYST REMOVAL;  Surgeon: Kirkland Hun, MD;  Location: [facility];  Service: Gynecology;  Laterality: N/A;  . LEG SURGERY     metal plate with screws  . right leg surgery    . TUBAL LIGATION    . VAGINAL HYSTERECTOMY N/A 05/16/2014   Procedure: TOTAL VAGINAL HYSTECTOMY ;  Surgeon: Kirkland Hun, MD;  Location: [facility];  Service: Gynecology;  Laterality: N/A;  . WISDOM TOOTH EXTRACTION       OB History   No obstetric history on file.      Home Medications    Prior to Admission medications   Medication Sig Start Date End Date Taking? Authorizing Provider  Cholecalciferol (VITAMIN D-3) 1000 units CAPS Take 1,000 Units by mouth daily.    [provider]  docusate sodium (COLACE) 100 MG capsule Take 1 capsule (100 mg total) by mouth every 12 (twelve) hours. 02/05/17   Ward, Layla Maw, DO  famotidine (PEPCID) 20 MG tablet Take 1 tablet (20 mg total) by mouth at bedtime as needed for heartburn. Patient not taking: Reported on 02/04/2017 05/17/14   Henreitta Leber, PA-C  HYDROmorphone (DILAUDID) 2 MG tablet Take 1 tablet (2 mg total) by mouth every 6 (six) hours as needed for moderate pain or severe pain. Patient not taking: Reported on 02/04/2017 05/17/14   Henreitta Leber, PA-C  ibuprofen (ADVIL,MOTRIN) 600 MG  tablet 1 PO PC every 6 hours for 5 days then prn-pain Patient not taking: Reported on 02/04/2017 05/17/14   Henreitta Leber, PA-C  ibuprofen (ADVIL,MOTRIN) 800 MG tablet Take 800 mg by mouth every 8 (eight) hours as needed (for pain).    [provider]  ondansetron (ZOFRAN ODT) 4 MG disintegrating tablet Take 1 tablet (4 mg total) by mouth every 6 (six) hours as needed for nausea or vomiting. 02/05/17   Ward, Layla Maw, DO  ondansetron (ZOFRAN ODT) 4 MG disintegrating tablet Take 1 tablet (4 mg total) by mouth every 8 (eight) hours as needed for nausea or vomiting. 04/06/18   Greg Eckrich A, DO  ondansetron (ZOFRAN) 4 MG  tablet Take 1 tablet (4 mg total) by mouth every 8 (eight) hours as needed for nausea or vomiting. Patient not taking: Reported on 02/04/2017 05/22/14   Standard, Venus, CNM  oxyCODONE-acetaminophen (PERCOCET/ROXICET) 5-325 MG tablet Take 2 tablets by mouth every 6 (six) hours as needed. 02/05/17   Ward, Layla Maw, DO    Family History No family history on file.  Social History Social History   Tobacco Use  . Smoking status: Never Smoker  . Smokeless tobacco: Never Used  Substance Use Topics  . Alcohol use: No  . Drug use: No     Allergies   Tramadol; Oxycodone; and Penicillins   Review of Systems Review of Systems  Constitutional: Positive for appetite change, chills and fatigue. Negative for diaphoresis.  Respiratory: Positive for chest tightness and shortness of breath. Negative for cough.   Cardiovascular: Positive for palpitations. Negative for chest pain and leg swelling.  Gastrointestinal: Positive for abdominal pain, constipation, nausea and vomiting. Negative for abdominal distention, blood in stool and diarrhea.  Musculoskeletal: Positive for back pain. Negative for neck stiffness.  Neurological: Positive for light-headedness. Negative for dizziness, syncope and headaches.     Physical Exam Updated Vital Signs BP 117/67 (BP Location: Right Arm)   Pulse 86   Temp 99.1 F (37.3 C) (Oral)   Resp 16   SpO2 98%   Physical Exam Constitution: tired appearing, supine in bed HENT: [place]/AT Eyes: eom intact, no scleral icterus  Cardio: RRR, no m/r/g Respiratory: CTA, no wheezing, rhonchi or rales  Abdominal: +BS, mild diffuse TTP, increased in epigastric area, non-distended MSK: moving all extremities, ambulating Neuro: a&o, cooperative, pleasant Skin: c/d/i    ED Treatments / Results  Labs (all labs ordered are listed, but only abnormal results are displayed) Labs Reviewed  CBC - Abnormal; Notable for the following components:      Result Value   RBC 5.24 (*)     All other components within normal limits  COMPREHENSIVE METABOLIC PANEL - Abnormal; Notable for the following components:   Glucose, Bld 117 (*)    All other components within normal limits  URINALYSIS, ROUTINE W REFLEX MICROSCOPIC - Abnormal; Notable for the following components:   Hgb urine dipstick SMALL (*)    Ketones, ur 20 (*)    Leukocytes, UA SMALL (*)    Bacteria, UA RARE (*)    All other components within normal limits  URINE CULTURE  LIPASE, BLOOD  LACTIC ACID, PLASMA  I-STAT TROPONIN, ED  I-STAT TROPONIN, ED    EKG EKG Interpretation  Date/Time:  Tuesday April 06 2018 11:33:42 EST Ventricular Rate:  96 PR Interval:  160 QRS Duration: 82 QT Interval:  346 QTC Calculation: 437 R Axis:   66 Text Interpretation:  Normal sinus rhythm with sinus arrhythmia Normal  ECG Confirmed by Tilden Fossaees, Elizabeth [phone]([id number]) on 04/06/2018 3:40:42 PM   Radiology Dg Chest 2 View  Result Date: 04/06/2018 CLINICAL DATA:  Chest pain.  Shortness of breath. EXAM: CHEST - 2 VIEW COMPARISON:  Chest x-ray dated 08/04/2017 and thoracic MRI dated 03/20/2017 FINDINGS: Heart size and vascularity are normal and the lungs are clear. Aortic atherosclerosis. No acute bone abnormality. Old compression fracture in the midthoracic spine. Old deformities in the upper lumbar spine and lower thoracic spine. IMPRESSION: No acute abnormalities. Aortic Atherosclerosis (ICD10-I70.0). Electronically Signed   By: Francene BoyersJames  Maxwell M.D.   On: 04/06/2018 11:58    Procedures Procedures (including critical care time)  Medications Ordered in ED Medications  sodium chloride flush (NS) 0.9 % injection 3 mL (3 mLs Intravenous Not Given 04/06/18 1606)  ondansetron (ZOFRAN-ODT) disintegrating tablet 4 mg (4 mg Oral Given 04/06/18 1143)  sodium chloride 0.9 % bolus 500 mL (0 mLs Intravenous Stopped 04/06/18 1823)     Initial Impression / Assessment and Plan / ED Course  I have reviewed the triage vital signs and the nursing  notes.  Pertinent labs & imaging results that were available during my care of the patient were reviewed by me and considered in my medical decision making (see chart for details).     63yo female with symptoms of nausea and malaise over the past 24 hours with one episode of emesis. On PE patient had mild tenderness to the abdomen. She does have a history significant for possible paroxysmal afib which is being worked up in the outpatient setting but labs and symptoms do not seem significant for abdominal ischemia at this time. UA negative for infection. Thought to be likely for viral GI illness. Provided zofran prescription and provided strict return precautions if symptoms do not improve or worsen.   Final Clinical Impressions(s) / ED Diagnoses   Final diagnoses:  Nausea    ED Discharge Orders         Ordered    ondansetron (ZOFRAN ODT) 4 MG disintegrating tablet  Every 8 hours PRN     04/06/18 1811           Versie StarksSeawell, Lunetta Marina A, DO 04/06/18 1923    Tilden Fossaees, Elizabeth, MD 04/09/18 1047

## 2018-04-08 LAB — URINE CULTURE

## 2018-04-12 ENCOUNTER — Encounter: Payer: Self-pay | Admitting: Cardiovascular Disease

## 2018-04-12 ENCOUNTER — Ambulatory Visit: Payer: BLUE CROSS/BLUE SHIELD | Admitting: Cardiovascular Disease

## 2018-04-12 VITALS — BP 110/70 | HR 68 | Ht 65.0 in | Wt 134.8 lb

## 2018-04-12 DIAGNOSIS — I472 Ventricular tachycardia, unspecified: Secondary | ICD-10-CM

## 2018-04-12 MED ORDER — METOPROLOL SUCCINATE ER 25 MG PO TB24: 25.0000 mg | ORAL_TABLET | Freq: Every day | ORAL | 6 refills | Status: DC

## 2018-04-12 NOTE — Patient Instructions (Addendum)
Medication Instructions:  Your physician has recommended you make the following change in your medication: Start Toprol 25 mg by mouth daily.   If you need a refill on your cardiac medications before your next appointment, please call your pharmacy.   Lab work: none If you have labs (blood work) drawn today and your tests are completely normal, you will receive your results only by: Marland Kitchen MyChart Message (if you have MyChart) OR . A paper copy in the mail If you have any lab test that is abnormal or we need to change your treatment, we will call you to review the results.  Testing/Procedures: Your physician has requested that you have an exercise tolerance test. For further information please visit [email or web address] Please also follow instruction sheet, as given.  Your physician has requested that you have an echocardiogram. Echocardiography is a painless test that uses sound waves to create images of your heart. It provides your doctor with information about the size and shape of your heart and how well your heart's chambers and valves are working. This procedure takes approximately one hour. There are no restrictions for this procedure.     Follow-Up: Your physician recommends that you schedule a follow-up appointment in: about 6-8 weeks.--Scheduled for April 16,2020 at 9:40

## 2018-04-12 NOTE — Progress Notes (Signed)
Chief Complaint  Patient presents with  . New Patient (Initial Visit)   History of Present Illness: 63 yo female with history of anxiety, depression and arthritis here today as a new consult, referred by Dr. Jeannetta Nap, for the evaluation of palpitations. She wore a cardiac monitor per Dr. Jeannetta Nap in November 2019 and this showed sinus rhythm with one 5 beat run of non-sustained VT. She only takes oxycodone for spinal fractures due to osteoporosis. She tells me that she notices her heart racing when she is lying down. This lasts a few seconds. This occurs several times per month. No associated dizziness, dyspnea or diaphoresis. She notes her heart rate will increase with exercise up to 200 bpm at times. No chest pain with exercise. She is very active. No prior cardiac issues. She smoked remotely but no cigarette use in last 30 years. No family history of premature CAD.   Primary Care Physician: Kaleen Mask, MD   Past Medical History:  Diagnosis Date  . Anxiety    no meds  . Arthritis    hands, lower back - no meds  . Depression    no meds  . Osteoporosis   . SVD (spontaneous vaginal delivery)    x 3    Past Surgical History:  Procedure Laterality Date  . ABDOMINAL HYSTERECTOMY    . ANTERIOR AND POSTERIOR REPAIR N/A 05/16/2014   Procedure: ANTERIOR (CYSTOCELE) AND POSTERIOR REPAIR (RECTOCELE);  Surgeon: Kirkland Hun, MD;  Location: [facility];  Service: Gynecology;  Laterality: N/A;  . BURCH PROCEDURE N/A 05/16/2014   Procedure: BURCH PROCEDURE;  Surgeon: Kirkland Hun, MD;  Location: [facility];  Service: Gynecology;  Laterality: N/A;  . EAR CYST EXCISION N/A 05/16/2014   Procedure: INCLUSION CYST REMOVAL;  Surgeon: Kirkland Hun, MD;  Location: [facility];  Service: Gynecology;  Laterality: N/A;  . LEG SURGERY     metal plate with screws  . right leg surgery    . TUBAL LIGATION    . VAGINAL HYSTERECTOMY N/A 05/16/2014   Procedure: TOTAL VAGINAL HYSTECTOMY ;  Surgeon: Kirkland Hun, MD;  Location: [facility];  Service: Gynecology;  Laterality: N/A;  . WISDOM TOOTH EXTRACTION      Current Outpatient Medications  Medication Sig Dispense Refill  . oxyCODONE-acetaminophen (PERCOCET/ROXICET) 5-325 MG tablet Take 2 tablets by mouth every 6 (six) hours as needed. 30 tablet 0  . metoprolol succinate (TOPROL XL) 25 MG 24 hr tablet Take 1 tablet (25 mg total) by mouth daily. 30 tablet 6   No current facility-administered medications for this visit.     Allergies  Allergen Reactions  . Tramadol Itching  . Oxycodone Rash    "Patient is allergic to narcotic pain medication"  . Penicillins Rash    Has patient had a PCN reaction causing immediate rash, facial/tongue/throat swelling, SOB or lightheadedness with hypotension: Yes Has patient had a PCN reaction causing severe rash involving mucus membranes or skin necrosis: No Has patient had a PCN reaction that required hospitalization: No Has patient had a PCN reaction occurring within the last 10 years: No If all of the above answers are "NO", then may proceed with Cephalosporin use.     Social History   Socioeconomic History  . Marital status: Married    Spouse name: Not on file  . Number of children: 3  . Years of education: Not on file  . Highest education level: Not on file  Occupational History  . Occupation: Retired  Engineer, production  .  Financial resource strain: Not on file  . Food insecurity:    Worry: Not on file    Inability: Not on file  . Transportation needs:    Medical: Not on file    Non-medical: Not on file  Tobacco Use  . Smoking status: Former Smoker    Packs/day: 0.20    Years: 15.00    Pack years: 3.00    Types: Cigarettes  . Smokeless tobacco: Never Used  Substance and Sexual Activity  . Alcohol use: No  . Drug use: No  . Sexual activity: Not on file  Lifestyle  . Physical activity:    Days per week: Not on file    Minutes per session: Not on file  . Stress: Not on file    Relationships  . Social connections:    Talks on phone: Not on file    Gets together: Not on file    Attends religious service: Not on file    Active member of club or organization: Not on file    Attends meetings of clubs or organizations: Not on file    Relationship status: Not on file  . Intimate partner violence:    Fear of current or ex partner: Not on file    Emotionally abused: Not on file    Physically abused: Not on file    Forced sexual activity: Not on file  Other Topics Concern  . Not on file  Social History Narrative  . Not on file    Family History  Problem Relation Age of Onset  . Brain cancer Father   . Heart attack Maternal Grandmother     Review of Systems:  As stated in the HPI and otherwise negative.   BP 110/70   Pulse 68   Ht 5\' 5"  (1.651 m)   Wt 134 lb 12.8 oz (61.1 kg)   SpO2 99%   BMI 22.43 kg/m   Physical Examination: General: Well developed, well nourished, NAD  HEENT: OP clear, mucus membranes moist  SKIN: warm, dry. No rashes. Neuro: No focal deficits  Musculoskeletal: Muscle strength 5/5 all ext  Psychiatric: Mood and affect normal  Neck: No JVD, no carotid bruits, no thyromegaly, no lymphadenopathy.  Lungs:Clear bilaterally, no wheezes, rhonci, crackles Cardiovascular: Regular rate and rhythm. No murmurs, gallops or rubs. Abdomen:Soft. Bowel sounds present. Non-tender.  Extremities: No lower extremity edema. Pulses are 2 + in the bilateral DP/PT.  EKG:  EKG is not ordered today. The ekg ordered today demonstrates   Recent Labs: 04/06/2018: ALT 15; BUN 11; Creatinine, Ser 0.62; Hemoglobin 14.0; Platelets 184; Potassium 4.4; Sodium 140   Lipid Panel No results found for: CHOL, TRIG, HDL, CHOLHDL, VLDL, LDLCALC, LDLDIRECT   Wt Readings from Last 3 Encounters:  04/12/18 134 lb 12.8 oz (61.1 kg)  05/16/14 155 lb (70.3 kg)  05/12/14 157 lb (71.2 kg)     Other studies Reviewed: Additional studies/ records that were reviewed today  include: . Review of the above records demonstrates:    Assessment and Plan:   1. Non-sustained VT: Will arrange echo to assess LV systolic function. Will arrange exercise stress test to exclude ischemia and to evaluate heart rate response with exercise. Will start Toprol 25 mg po Daily.   Current medicines are reviewed at length with the patient today.  The patient does not have concerns regarding medicines.  The following changes have been made:  no change  Labs/ tests ordered today include:   Orders Placed This Encounter  Procedures  . EXERCISE TOLERANCE TEST (ETT)  . ECHOCARDIOGRAM COMPLETE     Disposition:   FU with me in 6 weeks.    Signed, Verne Carrow, MD 04/12/2018 9:11 AM    [facility] [address] [place], [place], [place]  [address] Phone: [phone]; Fax: [phone]

## 2018-04-21 ENCOUNTER — Ambulatory Visit (INDEPENDENT_AMBULATORY_CARE_PROVIDER_SITE_OTHER): Payer: BLUE CROSS/BLUE SHIELD

## 2018-04-21 ENCOUNTER — Ambulatory Visit (HOSPITAL_COMMUNITY): Payer: BLUE CROSS/BLUE SHIELD | Attending: Cardiology

## 2018-04-21 DIAGNOSIS — I472 Ventricular tachycardia, unspecified: Secondary | ICD-10-CM

## 2018-04-21 LAB — EXERCISE TOLERANCE TEST
CSEPEW: 9.3 METS
Exercise duration (min): 8 min
Exercise duration (sec): 43 s
MPHR: 156 {beats}/min
Peak HR: 133 {beats}/min
Percent HR: 85 %
RPE: 15
Rest HR: 65 {beats}/min

## 2018-04-22 ENCOUNTER — Telehealth: Payer: Self-pay | Admitting: Cardiovascular Disease

## 2018-04-22 NOTE — Telephone Encounter (Signed)
Patient was returning call regarding echo results

## 2018-04-22 NOTE — Telephone Encounter (Signed)
Pt aware of GXT and echo results ./cy

## 2018-04-26 ENCOUNTER — Other Ambulatory Visit: Payer: Self-pay | Admitting: *Deleted

## 2018-04-26 MED ORDER — METOPROLOL SUCCINATE ER 25 MG PO TB24: 25.0000 mg | ORAL_TABLET | Freq: Every day | ORAL | 3 refills | Status: DC

## 2018-06-01 ENCOUNTER — Telehealth: Payer: Self-pay | Admitting: Cardiovascular Disease

## 2018-06-01 ENCOUNTER — Telehealth: Payer: Self-pay

## 2018-06-01 NOTE — Telephone Encounter (Signed)
New Message ° ° ° °Patient returning phone call  °

## 2018-06-01 NOTE — Telephone Encounter (Signed)
Called pt and lmtcb regarding setting up virtual visit for her appointment on 06-17-18 with Dr. Clifton James

## 2018-06-16 ENCOUNTER — Telehealth: Payer: Self-pay | Admitting: Cardiovascular Disease

## 2018-06-16 NOTE — Telephone Encounter (Signed)
4/15 :  Doximity Video visit (# [phone])  : Appointment on 4/16  @ 9:40    Phone Call to Obtain Consent - 06/16/2018       Virtual Visit Pre-Appointment Phone Call  Steps For Call:  1. Confirm consent - "In the setting of the current Covid19 crisis, you are scheduled for a (phone or video) visit with your provider on (date) at (time).  Just as we do with many in-office visits, in order for you to participate in this visit, we must obtain consent.  If you'd like, I can send this to your mychart (if signed up) or email for you to review.  Otherwise, I can obtain your verbal consent now.  All virtual visits are billed to your insurance company just like a normal visit would be.  By agreeing to a virtual visit, we'd like you to understand that the technology does not allow for your provider to perform an examination, and thus may limit your provider's ability to fully assess your condition.  Finally, though the technology is pretty good, we cannot assure that it will always work on either your or our end, and in the setting of a video visit, we may have to convert it to a phone-only visit.  In either situation, we cannot ensure that we have a secure connection.  Are you willing to proceed?" STAFF: Did the patient verbally acknowledge consent to telehealth visit? Document YES/NO here: YES  2. Confirm the BEST phone number to call the day of the visit by including in appointment notes  3. Give patient instructions for WebEx/MyChart download to smartphone as below or Doximity/Doxy.me if video visit (depending on what platform provider is using)  4. Advise patient to be prepared with their blood pressure, heart rate, weight, any heart rhythm information, their current medicines, and a piece of paper and pen handy for any instructions they may receive the day of their visit  5. Inform patient they will receive a phone call 15 minutes prior to their appointment time (may be from unknown caller  ID) so they should be prepared to answer  6. Confirm that appointment type is correct in Epic appointment notes (VIDEO vs PHONE)     TELEPHONE CALL NOTE  Alexis Barajas has been deemed a candidate for a follow-up tele-health visit to limit community exposure during the Covid-19 pandemic. I spoke with the patient via phone to ensure availability of phone/video source, confirm preferred email & phone number, and discuss instructions and expectations.  I reminded Alexis Barajas to be prepared with any vital sign and/or heart rhythm information that could potentially be obtained via home monitoring, at the time of her visit. I reminded Alexis Barajas to expect a phone call at the time of her visit if her visit.  Birdie Riddleshley Burton 06/16/2018 11:03 AM   INSTRUCTIONS FOR DOWNLOADING THE WEBEX APP TO SMARTPHONE  - If Apple, ask patient to go to App Store and type in WebEx in the search bar. Download [employer]Webex Meetings, the blue/green circle. If Android, go to [employer] Play Store and type in [employer]WebEx in the search bar. The app is free but as with any other app downloads, their phone may require them to verify saved payment information or Apple/Android password.  - The patient does NOT have to create an account. - On the day of the visit, the assist will walk the patient through joining the meeting with the meeting number/password.  INSTRUCTIONS FOR DOWNLOADING THE [facility] APP  TO SMARTPHONE  - The patient must first make sure to have activated MyChart and know their login information - If Apple, go to [employer] and type in MyChart in the search bar and download the app. If Android, ask patient to go to [employer] and type in [place] in the search bar and download the app. The app is free but as with any other app downloads, their phone may require them to verify saved payment information or Apple/Android password.  - The patient will need to then log into the app with their MyChart username and password, and  select [facility] as their healthcare provider to link the account. When it is time for your visit, go to the MyChart app, find appointments, and click Begin Video Visit. Be sure to Select Allow for your device to access the Microphone and Camera for your visit. You will then be connected, and your provider will be with you shortly.  **If they have any issues connecting, or need assistance please contact MyChart service desk [phone])**  **If using a computer, in order to ensure the best quality for their visit they will need to use either of the following Internet Browsers: [employer], or Google Chrome**  IF USING DOXIMITY or DOXY.ME - The patient will receive a link just prior to their visit, either by text or email (to be determined day of appointment depending on if it's doxy.me or Doximity).     FULL LENGTH CONSENT FOR TELE-HEALTH VISIT   I hereby voluntarily request, consent and authorize [facility] and its employed or contracted physicians, physician assistants, nurse practitioners or other licensed health care professionals (the Practitioner), to provide me with telemedicine health care services (the Services") as deemed necessary by the treating Practitioner. I acknowledge and consent to receive the Services by the Practitioner via telemedicine. I understand that the telemedicine visit will involve communicating with the Practitioner through live audiovisual communication technology and the disclosure of certain medical information by electronic transmission. I acknowledge that I have been given the opportunity to request an in-person assessment or other available alternative prior to the telemedicine visit and am voluntarily participating in the telemedicine visit.  I understand that I have the right to withhold or withdraw my consent to the use of telemedicine in the course of my care at any time, without affecting my right to future care or treatment, and that  the Practitioner or I may terminate the telemedicine visit at any time. I understand that I have the right to inspect all information obtained and/or recorded in the course of the telemedicine visit and may receive copies of available information for a reasonable fee.  I understand that some of the potential risks of receiving the Services via telemedicine include:   Delay or interruption in medical evaluation due to technological equipment failure or disruption;  Information transmitted may not be sufficient (e.g. poor resolution of images) to allow for appropriate medical decision making by the Practitioner; and/or   In rare instances, security protocols could fail, causing a breach of personal health information.  Furthermore, I acknowledge that it is my responsibility to provide information about my medical history, conditions and care that is complete and accurate to the best of my ability. I acknowledge that Practitioner's advice, recommendations, and/or decision may be based on factors not within their control, such as incomplete or inaccurate data provided by me or distortions of diagnostic images or specimens that may result from electronic transmissions. I understand  that the practice of medicine is not an exact science and that Practitioner makes no warranties or guarantees regarding treatment outcomes. I acknowledge that I will receive a copy of this consent concurrently upon execution via email to the email address I last provided but may also request a printed copy by calling the office of [facility].    I understand that my insurance will be billed for this visit.   I have read or had this consent read to me.  I understand the contents of this consent, which adequately explains the benefits and risks of the Services being provided via telemedicine.   I have been provided ample opportunity to ask questions regarding this consent and the Services and have had my questions answered to  my satisfaction.  I give my informed consent for the services to be provided through the use of telemedicine in my medical care  By participating in this telemedicine visit I agree to the above.

## 2018-06-17 ENCOUNTER — Other Ambulatory Visit: Payer: Self-pay

## 2018-06-17 ENCOUNTER — Encounter: Payer: Self-pay | Admitting: Cardiovascular Disease

## 2018-06-17 ENCOUNTER — Telehealth (INDEPENDENT_AMBULATORY_CARE_PROVIDER_SITE_OTHER): Payer: BLUE CROSS/BLUE SHIELD | Admitting: Cardiovascular Disease

## 2018-06-17 VITALS — HR 70 | Temp 98.7°F | Ht 65.0 in | Wt 133.0 lb

## 2018-06-17 DIAGNOSIS — I472 Ventricular tachycardia: Secondary | ICD-10-CM

## 2018-06-17 DIAGNOSIS — I4729 Other ventricular tachycardia: Secondary | ICD-10-CM

## 2018-06-17 NOTE — Progress Notes (Signed)
Virtual Visit via Video Note   This visit type was conducted due to national recommendations for restrictions regarding the COVID-19 Pandemic (e.g. social distancing) in an effort to limit this patient's exposure and mitigate transmission in our community.  Due to her co-morbid illnesses, this patient is at least at moderate risk for complications without adequate follow up.  This format is felt to be most appropriate for this patient at this time.  All issues noted in this document were discussed and addressed.  A limited physical exam was performed with this format.  Please refer to the patient's chart for her consent to telehealth for [facility].   Evaluation Performed:  Follow-up visit  Date:  06/17/2018   ID:  Alexis Barajas, DOB December 15, 1955, MRN [id number]  Patient Location: Home Provider Location: Home  PCP:  Kaleen Mask, MD  Cardiologist:  Verne Carrow, MD  Electrophysiologist:  None   Chief Complaint:  Follow up-Palpitations  History of Present Illness:    Alexis Barajas is a 63 y.o. female with history of palpitations, anxiety, depression and arthritis who I am seeing today through a virtual e-visit due to the COVID 19 pandemic. I saw her as a new consult 04/12/18 for the evaluation of palpitations. She wore a cardiac monitor per Dr. Jeannetta Nap in November 2019 and this showed sinus rhythm with one 5 beat run of non-sustained VT. She only takes oxycodone for spinal fractures due to osteoporosis. She only noticed her heart racing when lying down and this typically lasts for a few seconds occurring several times per month. No associated dizziness, dyspnea or diaphoresis. She notes her heart rate will increase with exercise up to 200 bpm at times. No chest pain with exercise. She is very active. No prior cardiac issues. She smoked remotely but no cigarette use in last 30 years. No family history of premature CAD. I started Toprol and arranged an echo and a stress test.  Echo 04/21/18 with LVEF=55-60%. No significant valve disease.  Nuclear stress test February 2020 with no ischemia.   She tells me today that she is doing well. No chest pain or dyspnea. Rare palpitations. She broke a bone in her back so she has been in severe pain.   The patient does not have symptoms concerning for COVID-19 infection (fever, chills, cough, or new shortness of breath).    Past Medical History:  Diagnosis Date   Anxiety    no meds   Arthritis    hands, lower back - no meds   Depression    no meds   NSVT (nonsustained ventricular tachycardia) (HCC)    Osteoporosis    SVD (spontaneous vaginal delivery)    x 3   Past Surgical History:  Procedure Laterality Date   ABDOMINAL HYSTERECTOMY     ANTERIOR AND POSTERIOR REPAIR N/A 05/16/2014   Procedure: ANTERIOR (CYSTOCELE) AND POSTERIOR REPAIR (RECTOCELE);  Surgeon: Kirkland Hun, MD;  Location: [facility];  Service: Gynecology;  Laterality: N/A;   BURCH PROCEDURE N/A 05/16/2014   Procedure: BURCH PROCEDURE;  Surgeon: Kirkland Hun, MD;  Location: [facility];  Service: Gynecology;  Laterality: N/A;   EAR CYST EXCISION N/A 05/16/2014   Procedure: INCLUSION CYST REMOVAL;  Surgeon: Kirkland Hun, MD;  Location: [facility];  Service: Gynecology;  Laterality: N/A;   LEG SURGERY     metal plate with screws   right leg surgery     TUBAL LIGATION     VAGINAL HYSTERECTOMY N/A 05/16/2014  Procedure: TOTAL VAGINAL HYSTECTOMY ;  Surgeon: Kirkland HunArthur Stringer, MD;  Location: [facility];  Service: Gynecology;  Laterality: N/A;   WISDOM TOOTH EXTRACTION       Current Meds  Medication Sig   loratadine (CLARITIN) 10 MG tablet Take 10 mg by mouth daily.   metoprolol succinate (TOPROL XL) 25 MG 24 hr tablet Take 1 tablet (25 mg total) by mouth daily.   oxyCODONE-acetaminophen (PERCOCET/ROXICET) 5-325 MG tablet Take 2 tablets by mouth every 6 (six) hours as needed.     Allergies:   Tramadol; Oxycodone; and Penicillins   Social  History   Tobacco Use   Smoking status: Former Smoker    Packs/day: 0.20    Years: 15.00    Pack years: 3.00    Types: Cigarettes   Smokeless tobacco: Never Used  Substance Use Topics   Alcohol use: No   Drug use: No     Family Hx: The patient's family history includes Brain cancer in her father; Heart attack in her maternal grandmother.  ROS:   Please see the history of present illness.    All other systems reviewed and are negative.   Prior CV studies:   The following studies were reviewed today:  Echo 04/21/18: 1. The left ventricle has normal systolic function, with an ejection fraction of 55-60%. The cavity size was normal. Left ventricular diastolic Doppler parameters are consistent with pseudonormalization No evidence of left ventricular regional wall  motion abnormalities.  2. The right ventricle has normal systolic function. The cavity was normal. There is no increase in right ventricular wall thickness.  3. Left atrial size was mildly dilated.  4. The mitral valve is normal in structure. No evidence of mitral valve stenosis. Trivial regurgitation.  5. The tricuspid valve is normal in structure.  6. The aortic valve is tricuspid Mild calcification of the aortic valve. no stenosis of the aortic valve.  7. The pulmonic valve was normal in structure.  8. The aortic root and ascending aorta are normal in size and structure.  9. Normal IVC. PA systolic pressure 23 mmHg.  Labs/Other Tests and Data Reviewed:    EKG:  No ECG reviewed.  Recent Labs: 04/06/2018: ALT 15; BUN 11; Creatinine, Ser 0.62; Hemoglobin 14.0; Platelets 184; Potassium 4.4; Sodium 140   Recent Lipid Panel No results found for: CHOL, TRIG, HDL, CHOLHDL, LDLCALC, LDLDIRECT  Wt Readings from Last 3 Encounters:  06/17/18 133 lb (60.3 kg)  04/12/18 134 lb 12.8 oz (61.1 kg)  05/16/14 155 lb (70.3 kg)     Objective:    Vital Signs:  Pulse 70    Temp 98.7 F (37.1 C)    Ht 5\' 5"  (1.651 m)    Wt  133 lb (60.3 kg)    SpO2 90%    BMI 22.13 kg/m    Well nourished, well developed female in no acute distress.  Unable to obtain remainder of exam due to virtual visit  ASSESSMENT & PLAN:    1. Non-sustained VT: She has rare palpitations. In the setting of normal LV systolic function and no ischemia on stress test, I would not recommend further cardiac workup at this time. Continue beta blocker.   COVID-19 Education: The signs and symptoms of COVID-19 were discussed with the patient and how to seek care for testing (follow up with PCP or arrange E-visit).  The importance of social distancing was discussed today.  Time:   Today, I have spent 15 minutes with the patient with telehealth technology  discussing the above problems.     Medication Adjustments/Labs and Tests Ordered: Current medicines are reviewed at length with the patient today.  Concerns regarding medicines are outlined above.   Tests Ordered: No orders of the defined types were placed in this encounter.   Medication Changes: No orders of the defined types were placed in this encounter.   Disposition:  Follow up in 6 month(s)  Signed, Verne Carrow, MD  06/17/2018 10:21 AM    [facility]

## 2018-06-17 NOTE — Patient Instructions (Signed)
Medication Instructions:  No changes  If you need a refill on your cardiac medications before your next appointment, please call your pharmacy.   Lab work: none  Testing/Procedures: none  Follow-Up: At [facility], you and your health needs are our priority.  As part of our continuing mission to provide you with exceptional heart care, we have created designated Provider Care Teams.  These Care Teams include your primary Cardiologist (physician) and Advanced Practice Providers (APPs -  Physician Assistants and Nurse Practitioners) who all work together to provide you with the care you need, when you need it. You will need a follow up appointment in 6 months.  Please call our office 2 months in advance to schedule this appointment.  You may see Verne Carrow, MD or one of the following Advanced Practice Providers on your designated Care Team:   [place], PA-C Ronie Spies, PA-C . Jacolyn Reedy, PA-C  Any Other Special Instructions Will Be Listed Below (If Applicable). We will contact you to arrange follow up appointments for you and your husband.  Stay safe!

## 2018-11-18 ENCOUNTER — Ambulatory Visit
Admission: RE | Admit: 2018-11-18 | Discharge: 2018-11-18 | Disposition: A | Discharge status: Home / Self Care | Payer: BLUE CROSS/BLUE SHIELD | Source: Ambulatory Visit | Attending: Family Medicine | Admitting: Family Medicine

## 2018-11-18 ENCOUNTER — Other Ambulatory Visit: Payer: Self-pay | Admitting: Family Medicine

## 2018-11-18 DIAGNOSIS — R0989 Other specified symptoms and signs involving the circulatory and respiratory systems: Secondary | ICD-10-CM

## 2018-11-23 ENCOUNTER — Other Ambulatory Visit: Payer: Self-pay | Admitting: Family Medicine

## 2018-11-23 DIAGNOSIS — R9389 Abnormal findings on diagnostic imaging of other specified body structures: Secondary | ICD-10-CM

## 2018-12-03 ENCOUNTER — Ambulatory Visit
Admission: RE | Admit: 2018-12-03 | Discharge: 2018-12-03 | Disposition: A | Discharge status: Home / Self Care | Payer: Medicare Other | Source: Ambulatory Visit | Attending: Family Medicine | Admitting: Family Medicine

## 2018-12-03 ENCOUNTER — Other Ambulatory Visit: Payer: Self-pay

## 2018-12-03 DIAGNOSIS — R9389 Abnormal findings on diagnostic imaging of other specified body structures: Secondary | ICD-10-CM

## 2019-01-06 ENCOUNTER — Other Ambulatory Visit: Payer: Self-pay

## 2019-01-06 ENCOUNTER — Encounter: Payer: Self-pay | Admitting: Critical Care Medicine

## 2019-01-06 ENCOUNTER — Ambulatory Visit: Payer: Medicare Other | Admitting: Critical Care Medicine

## 2019-01-06 VITALS — BP 120/62 | HR 91 | Temp 97.5°F | Ht 60.0 in | Wt 135.2 lb

## 2019-01-06 DIAGNOSIS — R0602 Shortness of breath: Secondary | ICD-10-CM | POA: Diagnosis not present

## 2019-01-06 DIAGNOSIS — R062 Wheezing: Secondary | ICD-10-CM | POA: Diagnosis not present

## 2019-01-06 DIAGNOSIS — Z79891 Long term (current) use of opiate analgesic: Secondary | ICD-10-CM

## 2019-01-06 DIAGNOSIS — M8000XS Age-related osteoporosis with current pathological fracture, unspecified site, sequela: Secondary | ICD-10-CM | POA: Diagnosis not present

## 2019-01-06 MED ORDER — ANORO ELLIPTA 62.5-25 MCG/INH IN AEPB: 1.0000 | INHALATION_SPRAY | Freq: Every day | RESPIRATORY_TRACT | 11 refills | Status: DC

## 2019-01-06 NOTE — Patient Instructions (Addendum)
Thank you for visiting Dr. Carlis Abbott at [facility]. We recommend the following: Orders Placed This Encounter  Procedures  . Pulmonary function test   Orders Placed This Encounter  Procedures  . Pulmonary function test    Standing Status:   Future    Standing Expiration Date:   01/06/2020    Order Specific Question:   Where should this test be performed?    Answer:   [facility]    Order Specific Question:   Full PFT: includes the following: basic spirometry, spirometry pre & post bronchodilator, diffusion capacity (DLCO), lung volumes    Answer:   Full PFT    Meds ordered this encounter  Medications  . umeclidinium-vilanterol (ANORO ELLIPTA) 62.5-25 MCG/INH AEPB    Sig: Inhale 1 puff into the lungs daily.    Dispense:  1 each    Refill:  11    Return in about 1 month (around 02/05/2019).  Don't forget to get your flu and pneumonia shots!   Please do your part to reduce the spread of COVID-19.

## 2019-01-06 NOTE — Progress Notes (Signed)
   Subjective:    Patient ID: Alexis Barajas, female    DOB: 12/06/1955, 63 y.o.   MRN: [id number]  HPI    Review of Systems  Constitutional: Negative for fever and unexpected weight change.  HENT: Positive for dental problem and trouble swallowing. Negative for congestion, ear pain, nosebleeds, postnasal drip, rhinorrhea, sinus pressure, sneezing and sore throat.   Eyes: Negative for redness and itching.  Respiratory: Positive for cough and shortness of breath. Negative for chest tightness and wheezing.   Cardiovascular: Positive for palpitations. Negative for leg swelling.  Gastrointestinal: Negative for nausea and vomiting.  Genitourinary: Negative for dysuria.  Musculoskeletal: Negative for joint swelling.  Skin: Negative for rash.  Neurological: Negative for headaches.  Hematological: Does not bruise/bleed easily.  Psychiatric/Behavioral: Negative for dysphoric mood. The patient is not nervous/anxious.        Objective:   Physical Exam        Assessment & Plan:

## 2019-01-06 NOTE — Progress Notes (Signed)
Synopsis: Referred in member 2020 for SOB by Kaleen Mask, *  Subjective:   PATIENT ID: Alexis Barajas GENDER: female DOB: 1956-02-20, MRN: [id number]  Chief Complaint  Patient presents with  . Pulmonary Consult    Alexis Barajas is a 63 year old woman who presents for evaluation of wheezing, chronic cough, and shortness of breath.  She is accompanied by her husband.  Her shortness of breath began about a year ago when she had spontaneous rib fractures due to osteoporosis.  Her shortness of breath has progressively worsened recently.  She was prescribed Symbicort, which initially improved her symptoms, but she stopped when she developed headaches.  She was prescribed albuterol, but has not yet used it.  She has chronic cough with difficult to clear sputum.  She wheezes more at night than during the day.  She is limited in her activity due to shortness of breath; she thinks she could walk about 1 block outside.  She has no previous history of asthma or other lung disease.  Her mother and brother had COPD.  She quit smoking in 1990 after 1 pack/day x 18 years.  She has osteoporosis with 9 previous fractures.  She thinks due to taking oxycodone chronically for the last year she has lost about 25 pounds over the last 1 to 2 years.  She has not yet had flu or pneumonia vaccines.      Past Medical History:  Diagnosis Date  . Anxiety    no meds  . Arthritis    hands, lower back - no meds  . Depression    no meds  . NSVT (nonsustained ventricular tachycardia) (HCC)   . Osteoporosis   . SVD (spontaneous vaginal delivery)    x 3   rib fractures  Family History  Problem Relation Age of Onset  . COPD Mother   . Brain cancer Father   . Heart attack Maternal Grandmother   . COPD Brother      Past Surgical History:  Procedure Laterality Date  . ABDOMINAL HYSTERECTOMY    . ANTERIOR AND POSTERIOR REPAIR N/A 05/16/2014   Procedure: ANTERIOR (CYSTOCELE) AND POSTERIOR REPAIR (RECTOCELE);   Surgeon: Kirkland Hun, MD;  Location: [facility];  Service: Gynecology;  Laterality: N/A;  . BURCH PROCEDURE N/A 05/16/2014   Procedure: BURCH PROCEDURE;  Surgeon: Kirkland Hun, MD;  Location: [facility];  Service: Gynecology;  Laterality: N/A;  . EAR CYST EXCISION N/A 05/16/2014   Procedure: INCLUSION CYST REMOVAL;  Surgeon: Kirkland Hun, MD;  Location: [facility];  Service: Gynecology;  Laterality: N/A;  . LEG SURGERY     metal plate with screws  . right leg surgery    . TUBAL LIGATION    . VAGINAL HYSTERECTOMY N/A 05/16/2014   Procedure: TOTAL VAGINAL HYSTECTOMY ;  Surgeon: Kirkland Hun, MD;  Location: [facility];  Service: Gynecology;  Laterality: N/A;  . WISDOM TOOTH EXTRACTION      Social History   Socioeconomic History  . Marital status: Married    Spouse name: Not on file  . Number of children: 3  . Years of education: Not on file  . Highest education level: Not on file  Occupational History  . Occupation: Retired  Engineer, production  . Financial resource strain: Not on file  . Food insecurity    Worry: Not on file    Inability: Not on file  . Transportation needs    Medical: Not on file    Non-medical: Not on file  Tobacco  Use  . Smoking status: Former Smoker    Packs/day: 1.00    Years: 18.00    Pack years: 18.00    Types: Cigarettes    Quit date: 1990    Years since quitting: 30.8  . Smokeless tobacco: Never Used  Substance and Sexual Activity  . Alcohol use: No  . Drug use: No  . Sexual activity: Not on file  Lifestyle  . Physical activity    Days per week: Not on file    Minutes per session: Not on file  . Stress: Not on file  Relationships  . Social Herbalist on phone: Not on file    Gets together: Not on file    Attends religious service: Not on file    Active member of club or organization: Not on file    Attends meetings of clubs or organizations: Not on file    Relationship status: Not on file  . Intimate partner violence    Fear of current  or ex partner: Not on file    Emotionally abused: Not on file    Physically abused: Not on file    Forced sexual activity: Not on file  Other Topics Concern  . Not on file  Social History Narrative  . Not on file     Allergies  Allergen Reactions  . Tramadol Itching  . Oxycodone Rash    "Patient is allergic to narcotic pain medication"  . Penicillins Rash    Has patient had a PCN reaction causing immediate rash, facial/tongue/throat swelling, SOB or lightheadedness with hypotension: Yes Has patient had a PCN reaction causing severe rash involving mucus membranes or skin necrosis: No Has patient had a PCN reaction that required hospitalization: No Has patient had a PCN reaction occurring within the last 10 years: No If all of the above answers are "NO", then may proceed with Cephalosporin use.       There is no immunization history on file for this patient.  She has never had flu or pneumonia vaccines.  Outpatient Medications Prior to Visit  Medication Sig Dispense Refill  . loratadine (CLARITIN) 10 MG tablet Take 10 mg by mouth daily.    . metoprolol succinate (TOPROL XL) 25 MG 24 hr tablet Take 1 tablet (25 mg total) by mouth daily. 90 tablet 3  . oxyCODONE-acetaminophen (PERCOCET/ROXICET) 5-325 MG tablet Take 2 tablets by mouth every 6 (six) hours as needed. 30 tablet 0  . SPIRIVA RESPIMAT 2.5 MCG/ACT AERS     . VENTOLIN HFA 108 (90 Base) MCG/ACT inhaler      No facility-administered medications prior to visit.     Review of Systems  Constitutional: Positive for chills. Negative for fever, malaise/fatigue and weight loss.  HENT: Negative for congestion and sore throat.   Eyes: Negative.   Respiratory: Positive for cough, sputum production, shortness of breath and wheezing. Negative for hemoptysis.   Cardiovascular: Negative for chest pain, palpitations and leg swelling.  Gastrointestinal: Negative for blood in stool, constipation, heartburn, nausea and vomiting.   Genitourinary: Negative for dysuria and hematuria.  Musculoskeletal: Positive for back pain and joint pain. Negative for myalgias.  Skin: Negative for rash.  Neurological: Negative for focal weakness and headaches.  Endo/Heme/Allergies: Negative.   Psychiatric/Behavioral: Negative.      Objective:   Vitals:   01/06/19 0949  BP: 120/62  Pulse: 91  Temp: (!) 97.5 F (36.4 C)  TempSrc: Oral  SpO2: 97%  Weight: 135 lb 3.2 oz (  61.3 kg)  Height: 5' (1.524 m)   97% on   RA BMI Readings from Last 3 Encounters:  01/06/19 26.40 kg/m  06/17/18 22.13 kg/m  04/12/18 22.43 kg/m   Wt Readings from Last 3 Encounters:  01/06/19 135 lb 3.2 oz (61.3 kg)  06/17/18 133 lb (60.3 kg)  04/12/18 134 lb 12.8 oz (61.1 kg)    Physical Exam Vitals signs reviewed.  Constitutional:      General: She is not in acute distress.    Appearance: Normal appearance. She is not ill-appearing.  HENT:     Head: Normocephalic and atraumatic.     Nose:     Comments: Deferred due to masking requirement.    Mouth/Throat:     Comments: Deferred due to masking requirement. Eyes:     General: No scleral icterus. Neck:     Musculoskeletal: Neck supple.  Cardiovascular:     Rate and Rhythm: Normal rate and regular rhythm.     Heart sounds: No murmur.  Pulmonary:     Comments: Breathing comfortably on room air, no conversational dyspnea or tachypnea.  No witnessed coughing.  Prolonged expiration, but clear to auscultation bilaterally. Abdominal:     General: There is no distension.     Palpations: Abdomen is soft.     Tenderness: There is no abdominal tenderness.  Musculoskeletal:        General: No swelling or deformity.  Lymphadenopathy:     Cervical: No cervical adenopathy.  Skin:    General: Skin is warm and dry.     Findings: No rash.  Neurological:     General: No focal deficit present.     Mental Status: She is alert.     Motor: No weakness.     Coordination: Coordination normal.   Psychiatric:        Mood and Affect: Mood normal.        Behavior: Behavior normal.      CBC    Component Value Date/Time   WBC 6.4 04/06/2018 1145   RBC 5.24 (H) 04/06/2018 1145   HGB 14.0 04/06/2018 1145   HCT 44.1 04/06/2018 1145   PLT 184 04/06/2018 1145   MCV 84.2 04/06/2018 1145   MCH 26.7 04/06/2018 1145   MCHC 31.7 04/06/2018 1145   RDW 13.5 04/06/2018 1145   LYMPHSABS 1.3 02/05/2017 0144   MONOABS 0.4 02/05/2017 0144   EOSABS 0.2 02/05/2017 0144   BASOSABS 0.0 02/05/2017 0144    CHEMISTRY CMP     Component Value Date/Time   NA 140 04/06/2018 1145   K 4.4 04/06/2018 1145   CL 107 04/06/2018 1145   CO2 22 04/06/2018 1145   GLUCOSE 117 (H) 04/06/2018 1145   BUN 11 04/06/2018 1145   CREATININE 0.62 04/06/2018 1145   CALCIUM 9.3 04/06/2018 1145   PROT 7.0 04/06/2018 1145   ALBUMIN 3.9 04/06/2018 1145   AST 24 04/06/2018 1145   ALT 15 04/06/2018 1145   ALKPHOS 66 04/06/2018 1145   BILITOT 0.8 04/06/2018 1145   GFRNONAA >60 04/06/2018 1145   GFRAA >60 04/06/2018 1145     Chest Imaging- films reviewed: CT chest 12/03/2018-posterior right upper fully calcified nodules, airway thickening, inferior dependent opacity in the left lower lobe possibly consistent with atelectasis.  Borderline dilated and thickened airways in the lower lobes, lingula, and right middle lobe suggestive of early bronchiectasis, although the airways taper normally.  Lateral right middle lobe peripheral opacity.  Multiple compression fractures in the spine-stable from previous  images.  No mediastinal or hilar adenopathy or calcified lymph nodes.  CXR, 2 view 11/18/2018- thickened airways, RML possible cavitary lesion.  Right apical calcified nodule.  Pulmonary Functions Testing Results: No flowsheet data found.   Echocardiogram 04/21/2018: LVEF 55 to 60%, diastolic dysfunction.  Mildly dilated LA, normal RV.  Trivial MR, normal TV, aortic sclerosis without stenosis.  Exercise stress test  04/21/2018-normal exercise capacity, no ST segment or T wave changes suggestive of ischemia, overall low risk study.     Assessment & Plan:     ICD-10-CM   1. SOB (shortness of breath)  R06.02 Pulmonary function test  2. Wheezing  R06.2   3. Chronic prescription opiate use  Z79.891   4. Osteoporosis with current pathological fracture, unspecified osteoporosis type, sequela  M80.00XS     Shortness of breath and wheezing, suspect COPD given her previous tobacco history and reported improvement previously with LAMA use. -Start Anoro once daily.  Inhaler teaching provided. -Albuterol every 4 hours as needed.  Instructed on indications and technique for appropriate use. -PFTs to confirm diagnosis -Recommend flu shot and both pneumonia vaccines.  She refuses today due to chronic pain.  She reports she will get this done with her primary care provider when her pain is improved.  We discussed the importance of venting infections with chronic lung disease as she is at risk for more severe disease. -Continue handwashing, social distancing, mask wearing per COVID-19 precautions. -Congratulated her on her success with previous tobacco cessation   Osteoporosis, frequent fractures per her report, plans on long-term oxycodone from primary care provider for pain control -Recommend calcium and vitamin D supplementation, which she reports she is already taking -Recommend consideration for bisphosphonate or other disease modifying therapy  RTC in 4 to 6 weeks after PFTs.    Current Outpatient Medications:  .  loratadine (CLARITIN) 10 MG tablet, Take 10 mg by mouth daily., Disp: , Rfl:  .  metoprolol succinate (TOPROL XL) 25 MG 24 hr tablet, Take 1 tablet (25 mg total) by mouth daily., Disp: 90 tablet, Rfl: 3 .  oxyCODONE-acetaminophen (PERCOCET/ROXICET) 5-325 MG tablet, Take 2 tablets by mouth every 6 (six) hours as needed., Disp: 30 tablet, Rfl: 0 .  SPIRIVA RESPIMAT 2.5 MCG/ACT AERS, , Disp: , Rfl:   .  umeclidinium-vilanterol (ANORO ELLIPTA) 62.5-25 MCG/INH AEPB, Inhale 1 puff into the lungs daily., Disp: 1 each, Rfl: 11 .  VENTOLIN HFA 108 (90 Base) MCG/ACT inhaler, , Disp: , Rfl:    Steffanie Dunn, DO [facility] 01/06/2019 10:29 AM

## 2019-04-12 NOTE — Progress Notes (Signed)
Chief Complaint  Patient presents with  . Follow-up    Non-sustained VT   History of Present Illness: 64 yo female with history of anxiety, depression and arthritis here today for cardiac follow up. I saw her as a new patient in February 2020 for the evaluation of palpitations. She wore a cardiac monitor per Dr. Jeannetta Nap in November 2019 and this showed sinus rhythm with one 5 beat run of non-sustained VT.  I started Toprol and arranged an echo and a stress test. Echo 04/21/18 with LVEF=55-60%. No significant valve disease.  Nuclear stress test February 2020 with no ischemia.   She is here today for follow up. The patient denies any chest pain, dyspnea, palpitations, lower extremity edema, orthopnea, PND, dizziness, near syncope or syncope. She has chronic back pain.   Primary Care Physician: Kaleen Mask, MD  Past Medical History:  Diagnosis Date  . Anxiety    no meds  . Arthritis    hands, lower back - no meds  . Depression    no meds  . NSVT (nonsustained ventricular tachycardia) (HCC)   . Osteoporosis   . SVD (spontaneous vaginal delivery)    x 3   Past Surgical History:  Procedure Laterality Date  . ABDOMINAL HYSTERECTOMY    . ANTERIOR AND POSTERIOR REPAIR N/A 05/16/2014   Procedure: ANTERIOR (CYSTOCELE) AND POSTERIOR REPAIR (RECTOCELE);  Surgeon: Kirkland Hun, MD;  Location: [facility];  Service: Gynecology;  Laterality: N/A;  . BURCH PROCEDURE N/A 05/16/2014   Procedure: BURCH PROCEDURE;  Surgeon: Kirkland Hun, MD;  Location: [facility];  Service: Gynecology;  Laterality: N/A;  . EAR CYST EXCISION N/A 05/16/2014   Procedure: INCLUSION CYST REMOVAL;  Surgeon: Kirkland Hun, MD;  Location: [facility];  Service: Gynecology;  Laterality: N/A;  . LEG SURGERY     metal plate with screws  . right leg surgery    . TUBAL LIGATION    . VAGINAL HYSTERECTOMY N/A 05/16/2014   Procedure: TOTAL VAGINAL HYSTECTOMY ;  Surgeon: Kirkland Hun, MD;  Location: [facility];  Service:  Gynecology;  Laterality: N/A;  . WISDOM TOOTH EXTRACTION      Current Outpatient Medications  Medication Sig Dispense Refill  . calcium carbonate (CALCIUM 600) 600 MG TABS tablet Take 600 mg by mouth daily.    . Cholecalciferol (VITAMIN D3 PO) Take 1,000 mg by mouth daily.    . metoprolol succinate (TOPROL XL) 25 MG 24 hr tablet Take 1 tablet (25 mg total) by mouth daily. 90 tablet 3  . oxyCODONE-acetaminophen (PERCOCET/ROXICET) 5-325 MG tablet Take 2 tablets by mouth every 6 (six) hours as needed. 30 tablet 0  . SPIRIVA RESPIMAT 2.5 MCG/ACT AERS     . umeclidinium-vilanterol (ANORO ELLIPTA) 62.5-25 MCG/INH AEPB Inhale 1 puff into the lungs daily. 1 each 11  . VENTOLIN HFA 108 (90 Base) MCG/ACT inhaler     . VITAMIN K PO Take 700 mg by mouth daily.     No current facility-administered medications for this visit.    Allergies  Allergen Reactions  . Tramadol Itching  . Oxycodone Rash    "Patient is allergic to narcotic pain medication"  . Penicillins Rash    Has patient had a PCN reaction causing immediate rash, facial/tongue/throat swelling, SOB or lightheadedness with hypotension: Yes Has patient had a PCN reaction causing severe rash involving mucus membranes or skin necrosis: No Has patient had a PCN reaction that required hospitalization: No Has patient had a PCN reaction occurring within the last  10 years: No If all of the above answers are "NO", then may proceed with Cephalosporin use.     Social History   Socioeconomic History  . Marital status: Married    Spouse name: Not on file  . Number of children: 3  . Years of education: Not on file  . Highest education level: Not on file  Occupational History  . Occupation: Retired  Tobacco Use  . Smoking status: Former Smoker    Packs/day: 1.00    Years: 18.00    Pack years: 18.00    Types: Cigarettes    Quit date: 1990    Years since quitting: 31.1  . Smokeless tobacco: Never Used  Substance and Sexual Activity  .  Alcohol use: No  . Drug use: No  . Sexual activity: Not on file  Other Topics Concern  . Not on file  Social History Narrative  . Not on file   Social Determinants of Health   Financial Resource Strain:   . Difficulty of Paying Living Expenses: Not on file  Food Insecurity:   . Worried About Charity fundraiser in the Last Year: Not on file  . Ran Out of Food in the Last Year: Not on file  Transportation Needs:   . Lack of Transportation (Medical): Not on file  . Lack of Transportation (Non-Medical): Not on file  Physical Activity:   . Days of Exercise per Week: Not on file  . Minutes of Exercise per Session: Not on file  Stress:   . Feeling of Stress : Not on file  Social Connections:   . Frequency of Communication with Friends and Family: Not on file  . Frequency of Social Gatherings with Friends and Family: Not on file  . Attends Religious Services: Not on file  . Active Member of Clubs or Organizations: Not on file  . Attends Archivist Meetings: Not on file  . Marital Status: Not on file  Intimate Partner Violence:   . Fear of Current or Ex-Partner: Not on file  . Emotionally Abused: Not on file  . Physically Abused: Not on file  . Sexually Abused: Not on file    Family History  Problem Relation Age of Onset  . COPD Mother   . Brain cancer Father   . Heart attack Maternal Grandmother   . COPD Brother     Review of Systems:  As stated in the HPI and otherwise negative.   BP 110/70   Pulse 79   Ht 5' (1.524 m)   Wt 134 lb 6.4 oz (61 kg)   SpO2 99%   BMI 26.25 kg/m   Physical Examination: General: Well developed, well nourished, NAD  HEENT: OP clear, mucus membranes moist  SKIN: warm, dry. No rashes. Neuro: No focal deficits  Musculoskeletal: Muscle strength 5/5 all ext  Psychiatric: Mood and affect normal  Neck: No JVD, no carotid bruits, no thyromegaly, no lymphadenopathy.  Lungs:Clear bilaterally, no wheezes, rhonci,  crackles Cardiovascular: Regular rate and rhythm. No murmurs, gallops or rubs. Abdomen:Soft. Bowel sounds present. Non-tender.  Extremities: No lower extremity edema. Pulses are 2 + in the bilateral DP/PT.  EKG:  EKG is ordered today. The ekg ordered today demonstrates NSR, rate 79 bpm.   Recent Labs: No results found for requested labs within last 8760 hours.   Lipid Panel No results found for: CHOL, TRIG, HDL, CHOLHDL, VLDL, LDLCALC, LDLDIRECT   Wt Readings from Last 3 Encounters:  04/14/19 134 lb 6.4 oz (  61 kg)  01/06/19 135 lb 3.2 oz (61.3 kg)  06/17/18 133 lb (60.3 kg)     Other studies Reviewed: Additional studies/ records that were reviewed today include: . Review of the above records demonstrates:    Assessment and Plan:   1. Non-sustained VT: Normal LV systolic function by echo in 2020. No ischemia on stress test in 2020. No palpitations. Continue beta blocker.   Current medicines are reviewed at length with the patient today.  The patient does not have concerns regarding medicines.  The following changes have been made:  no change  Labs/ tests ordered today include:   Orders Placed This Encounter  Procedures  . EKG 12-Lead     Disposition:   FU with me in 12 months   Signed, Verne Carrow, MD 04/14/2019 9:50 AM    [facility] [address] [place], [place], [place]  [address] Phone: [phone]; Fax: [phone]

## 2019-04-14 ENCOUNTER — Other Ambulatory Visit: Payer: Self-pay

## 2019-04-14 ENCOUNTER — Ambulatory Visit: Payer: Medicare Other | Admitting: Cardiovascular Disease

## 2019-04-14 ENCOUNTER — Encounter (INDEPENDENT_AMBULATORY_CARE_PROVIDER_SITE_OTHER): Payer: Self-pay

## 2019-04-14 ENCOUNTER — Encounter: Payer: Self-pay | Admitting: Cardiovascular Disease

## 2019-04-14 VITALS — BP 110/70 | HR 79 | Ht 60.0 in | Wt 134.4 lb

## 2019-04-14 DIAGNOSIS — I472 Ventricular tachycardia: Secondary | ICD-10-CM | POA: Diagnosis not present

## 2019-04-14 DIAGNOSIS — I4729 Other ventricular tachycardia: Secondary | ICD-10-CM

## 2019-04-14 NOTE — Patient Instructions (Signed)

## 2019-04-21 ENCOUNTER — Ambulatory Visit: Payer: Medicare Other | Admitting: Critical Care Medicine

## 2019-04-25 ENCOUNTER — Ambulatory Visit: Payer: Medicare Other | Admitting: Critical Care Medicine

## 2019-04-26 ENCOUNTER — Other Ambulatory Visit: Payer: Self-pay | Admitting: Cardiovascular Disease

## 2019-10-11 ENCOUNTER — Other Ambulatory Visit: Payer: Self-pay | Admitting: Family Medicine

## 2019-10-11 ENCOUNTER — Ambulatory Visit
Admission: RE | Admit: 2019-10-11 | Discharge: 2019-10-11 | Disposition: A | Discharge status: Home / Self Care | Payer: Medicare Other | Source: Ambulatory Visit | Attending: Family Medicine | Admitting: Family Medicine

## 2019-10-11 DIAGNOSIS — R52 Pain, unspecified: Secondary | ICD-10-CM

## 2019-12-01 IMAGING — DX DG CHEST 2V
2 series · 2 of 2 positions shown · non-contrast
Comparison: Chest x-ray dated 08/04/2017 and thoracic MRI dated
03/20/2017

CLINICAL DATA: Chest pain.  Shortness of breath.

EXAM:
CHEST - 2 VIEW

[w chest pa]
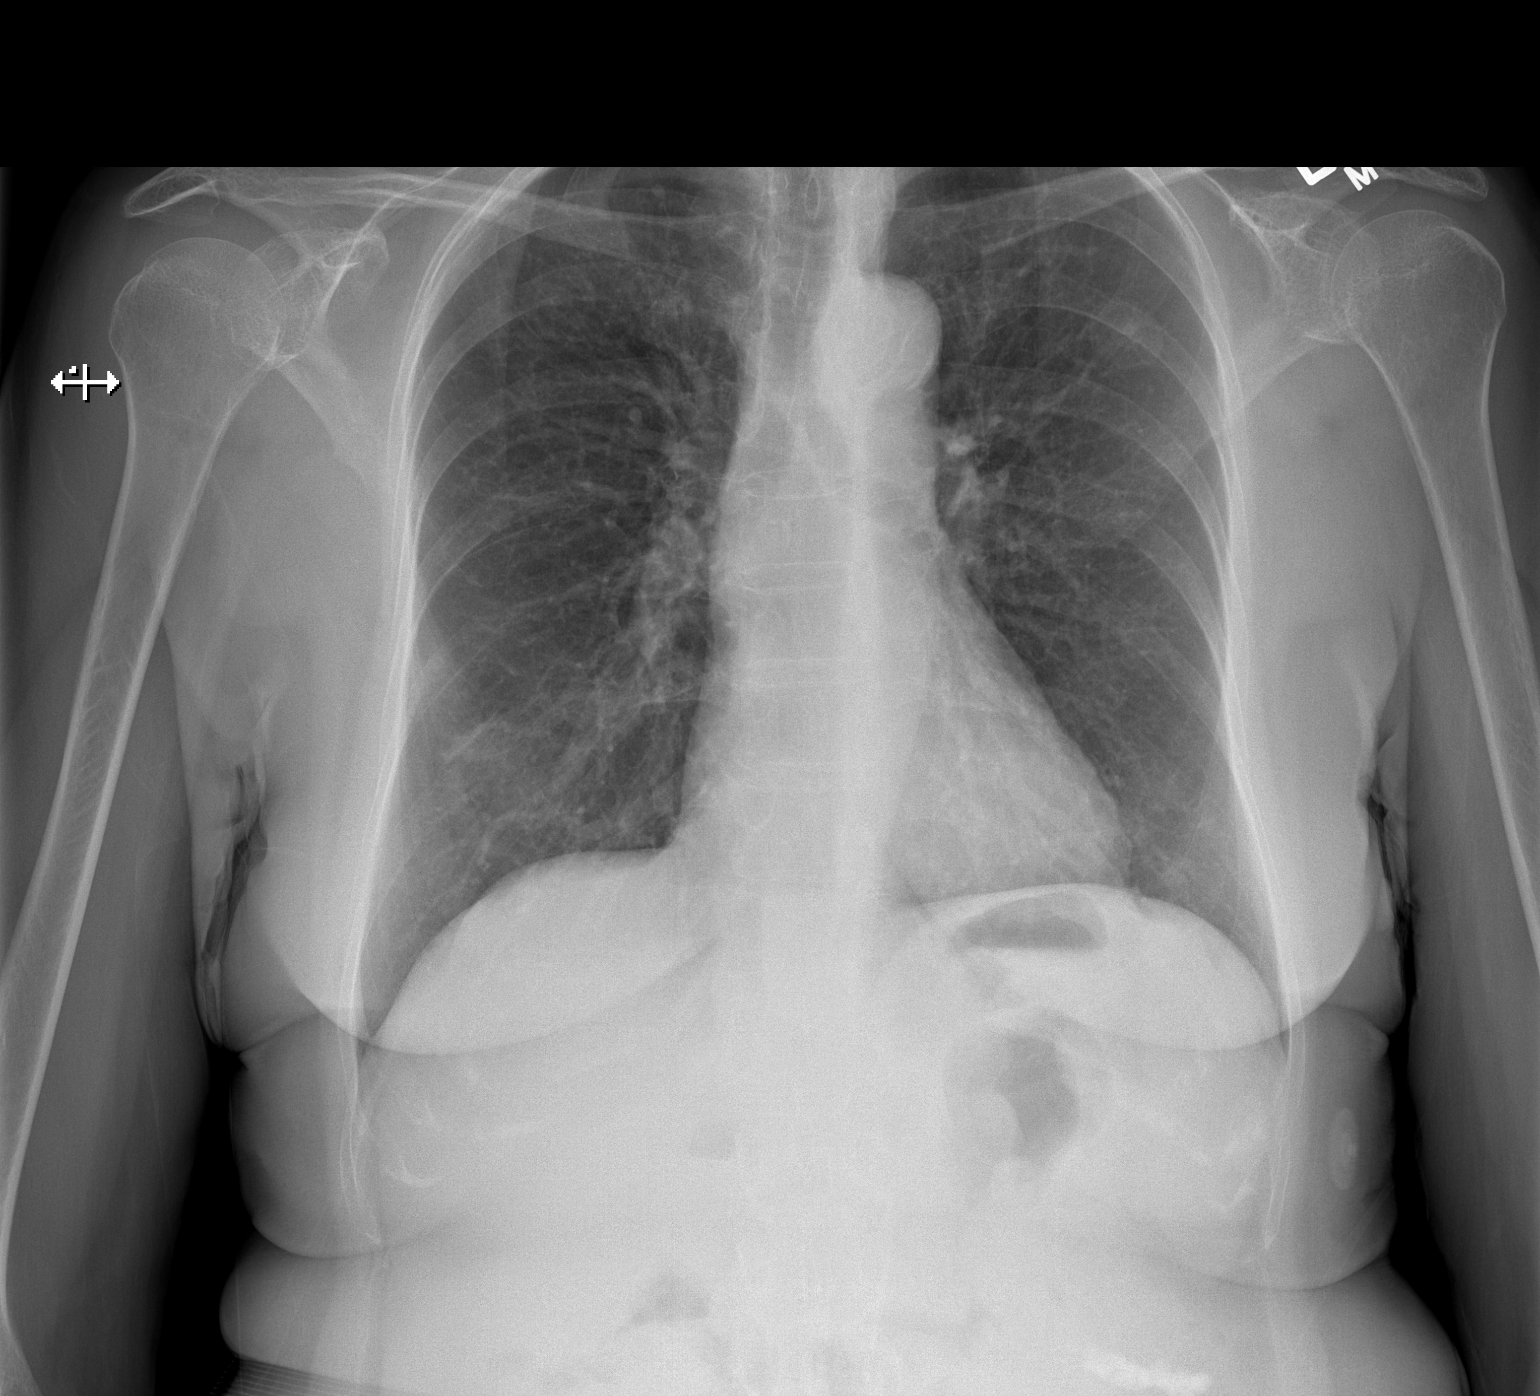

[w chest lat]
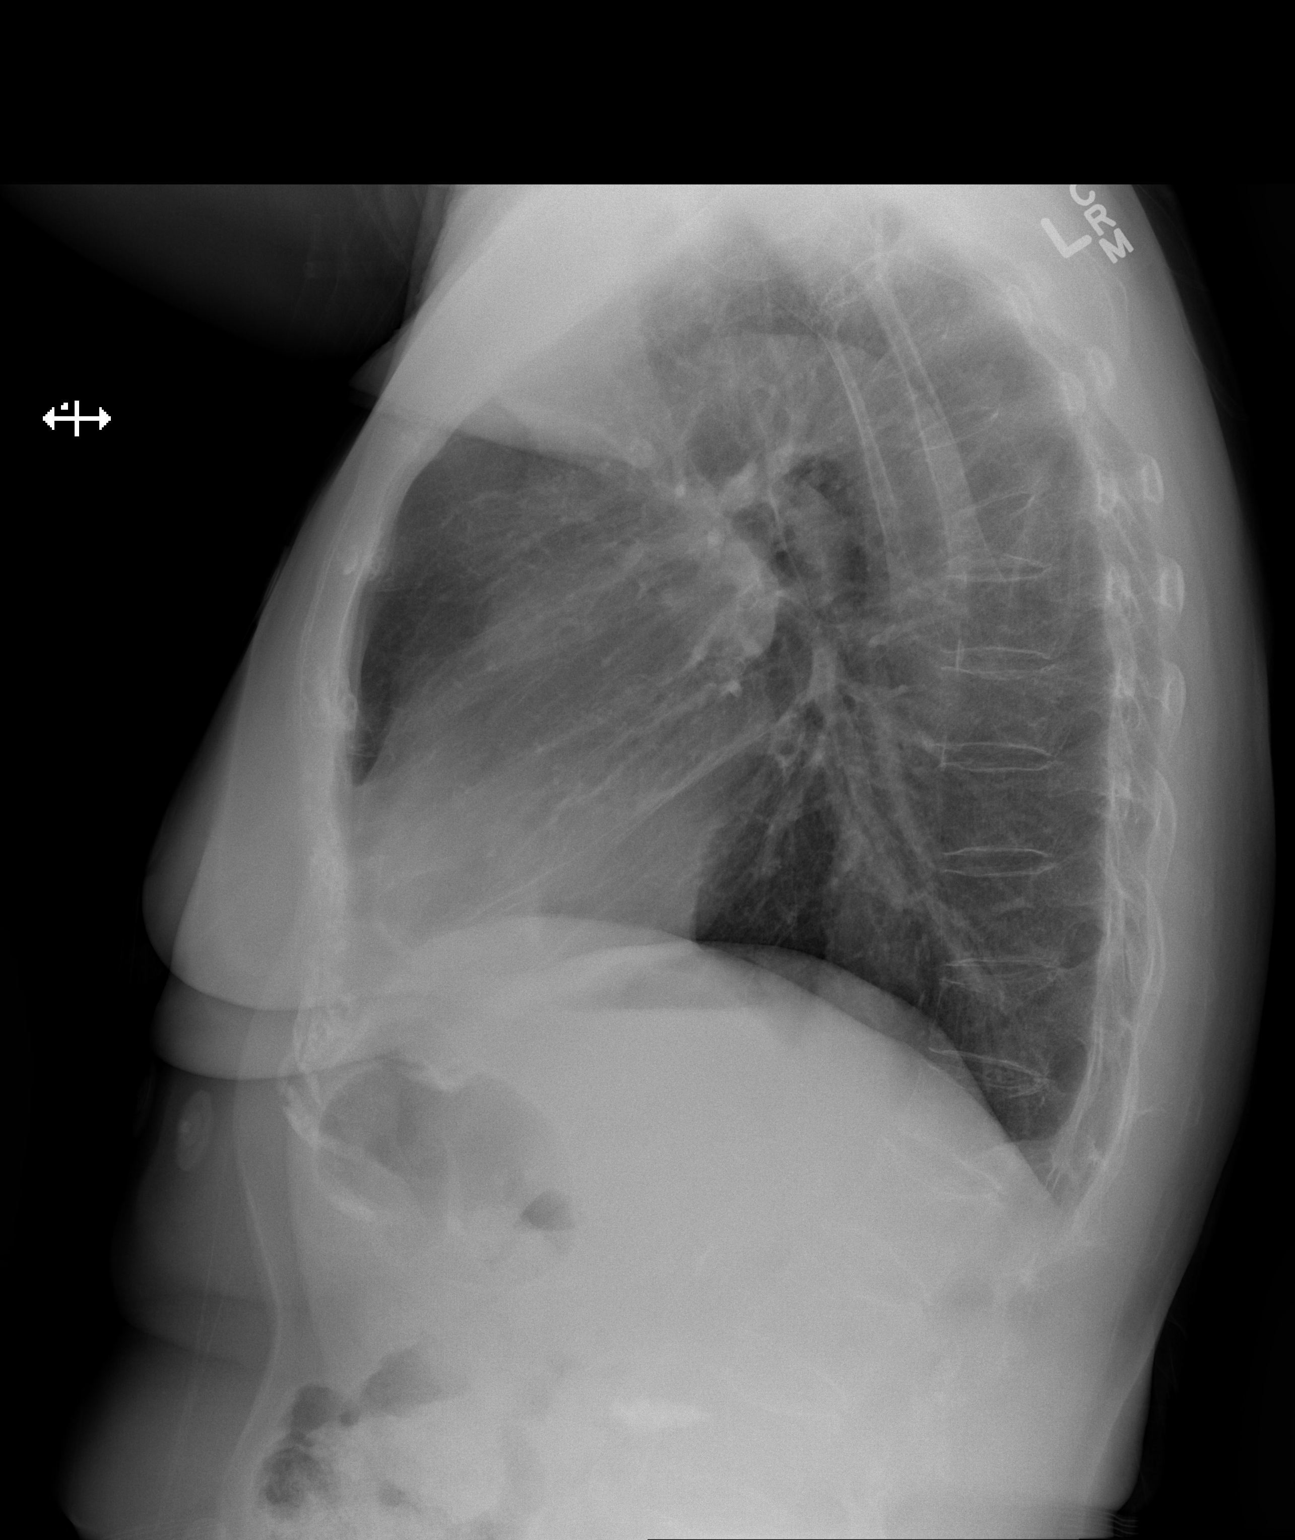

[2 of 2 positions shown; findings below may reference images not displayed]

FINDINGS: Heart size and vascularity are normal and the lungs are clear.
Aortic atherosclerosis. No acute bone abnormality. Old compression
fracture in the midthoracic spine. Old deformities in the upper
lumbar spine and lower thoracic spine.
IMPRESSION: No acute abnormalities.

Aortic Atherosclerosis ([id number]).

## 2020-01-18 ENCOUNTER — Other Ambulatory Visit: Payer: Self-pay | Admitting: Critical Care Medicine

## 2020-03-10 ENCOUNTER — Other Ambulatory Visit: Payer: Self-pay | Admitting: Critical Care Medicine

## 2020-03-14 ENCOUNTER — Other Ambulatory Visit: Payer: Self-pay | Admitting: Critical Care Medicine

## 2020-03-17 ENCOUNTER — Other Ambulatory Visit: Payer: Self-pay | Admitting: Critical Care Medicine

## 2020-04-17 ENCOUNTER — Ambulatory Visit: Payer: Medicare Other | Admitting: Cardiovascular Disease

## 2020-04-17 ENCOUNTER — Other Ambulatory Visit: Payer: Self-pay

## 2020-04-17 ENCOUNTER — Encounter: Payer: Self-pay | Admitting: Cardiovascular Disease

## 2020-04-17 VITALS — BP 116/70 | HR 64 | Ht 60.0 in | Wt 122.8 lb

## 2020-04-17 DIAGNOSIS — I472 Ventricular tachycardia: Secondary | ICD-10-CM | POA: Diagnosis not present

## 2020-04-17 DIAGNOSIS — I4729 Other ventricular tachycardia: Secondary | ICD-10-CM

## 2020-04-17 MED ORDER — METOPROLOL SUCCINATE ER 25 MG PO TB24: 25.0000 mg | ORAL_TABLET | Freq: Every day | ORAL | 3 refills | Status: DC

## 2020-04-17 NOTE — Progress Notes (Signed)
Chief Complaint  Patient presents with  . Follow-up    Palpitations   History of Present Illness: 65 yo female with history of anxiety, depression and arthritis here today for cardiac follow up. I saw her as a new patient in February 2020 for the evaluation of palpitations. She wore a cardiac monitor per Dr. Jeannetta Nap in November 2019 and this showed sinus rhythm with one 5 beat run of non-sustained VT.  I started Toprol and arranged an echo and a stress test. Echo 04/21/18 with LVEF=55-60%. No significant valve disease.  Nuclear stress test February 2020 with no ischemia.   She is here today for follow up. The patient denies any chest pain, dyspnea, palpitations, lower extremity edema, orthopnea, PND, dizziness, near syncope or syncope.   Primary Care Physician: Kaleen Mask, MD  Past Medical History:  Diagnosis Date  . Anxiety    no meds  . Arthritis    hands, lower back - no meds  . Depression    no meds  . NSVT (nonsustained ventricular tachycardia) (HCC)   . Osteoporosis   . SVD (spontaneous vaginal delivery)    x 3   Past Surgical History:  Procedure Laterality Date  . ABDOMINAL HYSTERECTOMY    . ANTERIOR AND POSTERIOR REPAIR N/A 05/16/2014   Procedure: ANTERIOR (CYSTOCELE) AND POSTERIOR REPAIR (RECTOCELE);  Surgeon: Kirkland Hun, MD;  Location: [facility];  Service: Gynecology;  Laterality: N/A;  . BURCH PROCEDURE N/A 05/16/2014   Procedure: BURCH PROCEDURE;  Surgeon: Kirkland Hun, MD;  Location: [facility];  Service: Gynecology;  Laterality: N/A;  . EAR CYST EXCISION N/A 05/16/2014   Procedure: INCLUSION CYST REMOVAL;  Surgeon: Kirkland Hun, MD;  Location: [facility];  Service: Gynecology;  Laterality: N/A;  . LEG SURGERY     metal plate with screws  . right leg surgery    . TUBAL LIGATION    . VAGINAL HYSTERECTOMY N/A 05/16/2014   Procedure: TOTAL VAGINAL HYSTECTOMY ;  Surgeon: Kirkland Hun, MD;  Location: [facility];  Service: Gynecology;  Laterality: N/A;  . WISDOM  TOOTH EXTRACTION      Current Outpatient Medications  Medication Sig Dispense Refill  . ANORO ELLIPTA 62.5-25 MCG/INH AEPB Inhale 1 puff by mouth once daily 60 each 0  . buPROPion (WELLBUTRIN XL) 150 MG 24 hr tablet Take 1 mg by mouth daily.    . calcium carbonate (OS-CAL) 600 MG TABS tablet Take 600 mg by mouth daily.    . Cholecalciferol (VITAMIN D3 PO) Take 1,000 mg by mouth daily.    Marland Kitchen oxyCODONE-acetaminophen (PERCOCET/ROXICET) 5-325 MG tablet Take 2 tablets by mouth every 6 (six) hours as needed. 30 tablet 0  . SPIRIVA RESPIMAT 2.5 MCG/ACT AERS     . VENTOLIN HFA 108 (90 Base) MCG/ACT inhaler     . VITAMIN K PO Take 700 mg by mouth daily.    . metoprolol succinate (TOPROL-XL) 25 MG 24 hr tablet Take 1 tablet (25 mg total) by mouth daily. 90 tablet 3   No current facility-administered medications for this visit.    Allergies  Allergen Reactions  . Tramadol Itching  . Oxycodone Rash    "Patient is allergic to narcotic pain medication"  . Penicillins Rash    Has patient had a PCN reaction causing immediate rash, facial/tongue/throat swelling, SOB or lightheadedness with hypotension: Yes Has patient had a PCN reaction causing severe rash involving mucus membranes or skin necrosis: No Has patient had a PCN reaction that required hospitalization: No Has  patient had a PCN reaction occurring within the last 10 years: No If all of the above answers are "NO", then may proceed with Cephalosporin use.     Social History   Socioeconomic History  . Marital status: Married    Spouse name: Not on file  . Number of children: 3  . Years of education: Not on file  . Highest education level: Not on file  Occupational History  . Occupation: Retired  Tobacco Use  . Smoking status: Former Smoker    Packs/day: 1.00    Years: 18.00    Pack years: 18.00    Types: Cigarettes    Quit date: 1990    Years since quitting: 32.1  . Smokeless tobacco: Never Used  Vaping Use  . Vaping Use:  Never used  Substance and Sexual Activity  . Alcohol use: No  . Drug use: No  . Sexual activity: Not on file  Other Topics Concern  . Not on file  Social History Narrative  . Not on file   Social Determinants of Health   Financial Resource Strain: Not on file  Food Insecurity: Not on file  Transportation Needs: Not on file  Physical Activity: Not on file  Stress: Not on file  Social Connections: Not on file  Intimate Partner Violence: Not on file    Family History  Problem Relation Age of Onset  . COPD Mother   . Brain cancer Father   . Heart attack Maternal Grandmother   . COPD Brother     Review of Systems:  As stated in the HPI and otherwise negative.   BP 116/70   Pulse 64   Ht 5' (1.524 m)   Wt 122 lb 12.8 oz (55.7 kg)   SpO2 97%   BMI 23.98 kg/m   Physical Examination: General: Well developed, well nourished, NAD  HEENT: OP clear, mucus membranes moist  SKIN: warm, dry. No rashes. Neuro: No focal deficits  Musculoskeletal: Muscle strength 5/5 all ext  Psychiatric: Mood and affect normal  Neck: No JVD, no carotid bruits, no thyromegaly, no lymphadenopathy.  Lungs:Clear bilaterally, no wheezes, rhonci, crackles Cardiovascular: Regular rate and rhythm. No murmurs, gallops or rubs. Abdomen:Soft. Bowel sounds present. Non-tender.  Extremities: No lower extremity edema. Pulses are 2 + in the bilateral DP/PT.  EKG:  EKG is ordered today. The ekg ordered today demonstrates sinus  Recent Labs: No results found for requested labs within last 8760 hours.   Lipid Panel No results found for: CHOL, TRIG, HDL, CHOLHDL, VLDL, LDLCALC, LDLDIRECT   Wt Readings from Last 3 Encounters:  04/17/20 122 lb 12.8 oz (55.7 kg)  04/14/19 134 lb 6.4 oz (61 kg)  01/06/19 135 lb 3.2 oz (61.3 kg)     Other studies Reviewed: Additional studies/ records that were reviewed today include: . Review of the above records demonstrates:    Assessment and Plan:   1.  Non-sustained VT: Normal LV systolic function by echo in 2020. No ischemia on stress test in 2020. She has no recent palpitations, near syncope or syncope. Continue beta blocker.   Current medicines are reviewed at length with the patient today.  The patient does not have concerns regarding medicines.  The following changes have been made:  no change  Labs/ tests ordered today include:   Orders Placed This Encounter  Procedures  . EKG 12-Lead     Disposition:   F/U with me in 12 months   Signed, Verne Carrow, MD 04/17/2020 3:52 PM  [facility] [place], [place], [place]  [address] Phone: [phone]; Fax: [phone]

## 2020-04-17 NOTE — Patient Instructions (Signed)

## 2020-04-22 ENCOUNTER — Other Ambulatory Visit: Payer: Self-pay | Admitting: Critical Care Medicine

## 2020-04-25 ENCOUNTER — Other Ambulatory Visit: Payer: Self-pay | Admitting: Critical Care Medicine

## 2020-04-27 ENCOUNTER — Other Ambulatory Visit: Payer: Self-pay | Admitting: Critical Care Medicine

## 2020-05-11 ENCOUNTER — Ambulatory Visit
Admission: RE | Admit: 2020-05-11 | Discharge: 2020-05-11 | Disposition: A | Discharge status: Home / Self Care | Payer: Medicare Other | Source: Ambulatory Visit | Attending: Family Medicine | Admitting: Family Medicine

## 2020-05-11 ENCOUNTER — Other Ambulatory Visit: Payer: Self-pay | Admitting: Family Medicine

## 2020-05-11 DIAGNOSIS — R0609 Other forms of dyspnea: Secondary | ICD-10-CM

## 2020-05-11 DIAGNOSIS — R0600 Dyspnea, unspecified: Secondary | ICD-10-CM

## 2020-05-11 DIAGNOSIS — R52 Pain, unspecified: Secondary | ICD-10-CM

## 2020-07-06 ENCOUNTER — Ambulatory Visit: Payer: Medicare Other | Admitting: Cardiovascular Disease

## 2021-01-09 ENCOUNTER — Other Ambulatory Visit: Payer: Self-pay | Admitting: Family Medicine

## 2021-01-09 DIAGNOSIS — R1310 Dysphagia, unspecified: Secondary | ICD-10-CM

## 2021-01-11 ENCOUNTER — Other Ambulatory Visit: Payer: Self-pay | Admitting: Family Medicine

## 2021-01-11 ENCOUNTER — Ambulatory Visit
Admission: RE | Admit: 2021-01-11 | Discharge: 2021-01-11 | Disposition: A | Discharge status: Home / Self Care | Payer: Medicare Other | Source: Ambulatory Visit | Attending: Family Medicine | Admitting: Family Medicine

## 2021-01-11 DIAGNOSIS — R059 Cough, unspecified: Secondary | ICD-10-CM

## 2021-01-11 DIAGNOSIS — R1310 Dysphagia, unspecified: Secondary | ICD-10-CM

## 2021-04-15 ENCOUNTER — Ambulatory Visit: Payer: Medicare Other | Admitting: Cardiovascular Disease

## 2021-04-26 ENCOUNTER — Other Ambulatory Visit: Payer: Self-pay

## 2021-04-26 ENCOUNTER — Encounter: Payer: Self-pay | Admitting: Cardiovascular Disease

## 2021-04-26 ENCOUNTER — Ambulatory Visit: Payer: Medicare Other | Admitting: Cardiovascular Disease

## 2021-04-26 VITALS — BP 120/70 | HR 62 | Ht 60.0 in | Wt 116.4 lb

## 2021-04-26 DIAGNOSIS — I4729 Other ventricular tachycardia: Secondary | ICD-10-CM

## 2021-04-26 MED ORDER — METOPROLOL SUCCINATE ER 25 MG PO TB24: 25.0000 mg | ORAL_TABLET | Freq: Every day | ORAL | 3 refills | Status: DC

## 2021-04-26 NOTE — Patient Instructions (Signed)
Medication Instructions:  Your physician recommends that you continue on your current medications as directed. Please refer to the Current Medication list given to you today.  *If you need a refill on your cardiac medications before your next appointment, please call your pharmacy*   Lab Work: NONE If you have labs (blood work) drawn today and your tests are completely normal, you will receive your results only by: MyChart Message (if you have MyChart) OR A paper copy in the mail If you have any lab test that is abnormal or we need to change your treatment, we will call you to review the results.   Testing/Procedures: NONE   Follow-Up: At [facility], you and your health needs are our priority.  As part of our continuing mission to provide you with exceptional heart care, we have created designated Provider Care Teams.  These Care Teams include your primary Cardiologist (physician) and Advanced Practice Providers (APPs -  Physician Assistants and Nurse Practitioners) who all work together to provide you with the care you need, when you need it.  We recommend signing up for the patient portal called "MyChart".  Sign up information is provided on this After Visit Summary.  MyChart is used to connect with patients for Virtual Visits (Telemedicine).  Patients are able to view lab/test results, encounter notes, upcoming appointments, etc.  Non-urgent messages can be sent to your provider as well.   To learn more about what you can do with MyChart, go to [email or web address].    Your next appointment:   2 year(s)  The format for your next appointment:   In Person  Provider:   Verne Carrow, MD

## 2021-04-26 NOTE — Progress Notes (Signed)
Chief Complaint  Patient presents with   Follow-up   History of Present Illness: 66 yo female with history of non-sustained VT, anxiety, depression and arthritis here today for cardiac follow up. I saw her as a new patient in February 2020 for the evaluation of palpitations. She wore a cardiac monitor per Dr. Jeannetta Nap in November 2019 and this showed sinus rhythm with one 5 beat run of non-sustained VT.  I started Toprol and arranged an echo and a stress test. Echo 04/21/18 with LVEF=55-60%. No significant valve disease.  Exercise stress test February 2020 with no ischemia.   She is here today for follow up. The patient denies any chest pain, dyspnea, palpitations, lower extremity edema, orthopnea, PND, dizziness, near syncope or syncope.   Primary Care Physician: Kaleen Mask, MD  Past Medical History:  Diagnosis Date   Anxiety    no meds   Arthritis    hands, lower back - no meds   Depression    no meds   NSVT (nonsustained ventricular tachycardia)    Osteoporosis    SVD (spontaneous vaginal delivery)    x 3   Past Surgical History:  Procedure Laterality Date   ABDOMINAL HYSTERECTOMY     ANTERIOR AND POSTERIOR REPAIR N/A 05/16/2014   Procedure: ANTERIOR (CYSTOCELE) AND POSTERIOR REPAIR (RECTOCELE);  Surgeon: Kirkland Hun, MD;  Location: [facility];  Service: Gynecology;  Laterality: N/A;   BURCH PROCEDURE N/A 05/16/2014   Procedure: BURCH PROCEDURE;  Surgeon: Kirkland Hun, MD;  Location: [facility];  Service: Gynecology;  Laterality: N/A;   EAR CYST EXCISION N/A 05/16/2014   Procedure: INCLUSION CYST REMOVAL;  Surgeon: Kirkland Hun, MD;  Location: [facility];  Service: Gynecology;  Laterality: N/A;   LEG SURGERY     metal plate with screws   right leg surgery     TUBAL LIGATION     VAGINAL HYSTERECTOMY N/A 05/16/2014   Procedure: TOTAL VAGINAL HYSTECTOMY ;  Surgeon: Kirkland Hun, MD;  Location: [facility];  Service: Gynecology;  Laterality: N/A;   WISDOM TOOTH EXTRACTION       Current Outpatient Medications  Medication Sig Dispense Refill   ANORO ELLIPTA 62.5-25 MCG/INH AEPB Inhale 1 puff by mouth once daily 60 each 0   buPROPion (WELLBUTRIN XL) 150 MG 24 hr tablet Take 1 mg by mouth daily.     calcium carbonate (OS-CAL) 600 MG TABS tablet Take 600 mg by mouth daily.     Cholecalciferol (VITAMIN D3 PO) Take 1,000 mg by mouth daily.     oxyCODONE-acetaminophen (PERCOCET/ROXICET) 5-325 MG tablet Take 2 tablets by mouth every 6 (six) hours as needed. 30 tablet 0   VENTOLIN HFA 108 (90 Base) MCG/ACT inhaler      VITAMIN K PO Take 700 mg by mouth daily.     metoprolol succinate (TOPROL-XL) 25 MG 24 hr tablet Take 1 tablet (25 mg total) by mouth daily. Per Dr. Clifton James OK to fill, pt not due to be seen again until 2025. 90 tablet 3   No current facility-administered medications for this visit.    Allergies  Allergen Reactions   Tramadol Itching   Oxycodone Rash    "Patient is allergic to narcotic pain medication"   Penicillins Rash    Has patient had a PCN reaction causing immediate rash, facial/tongue/throat swelling, SOB or lightheadedness with hypotension: Yes Has patient had a PCN reaction causing severe rash involving mucus membranes or skin necrosis: No Has patient had a PCN reaction that required hospitalization:  No Has patient had a PCN reaction occurring within the last 10 years: No If all of the above answers are "NO", then may proceed with Cephalosporin use.     Social History   Socioeconomic History   Marital status: Married    Spouse name: Not on file   Number of children: 3   Years of education: Not on file   Highest education level: Not on file  Occupational History   Occupation: Retired  Tobacco Use   Smoking status: Former    Packs/day: 1.00    Years: 18.00    Pack years: 18.00    Types: Cigarettes    Quit date: 1990    Years since quitting: 33.1   Smokeless tobacco: Never  Vaping Use   Vaping Use: Never used  Substance and  Sexual Activity   Alcohol use: No   Drug use: No   Sexual activity: Not on file  Other Topics Concern   Not on file  Social History Narrative   Not on file   Social Determinants of Health   Financial Resource Strain: Not on file  Food Insecurity: Not on file  Transportation Needs: Not on file  Physical Activity: Not on file  Stress: Not on file  Social Connections: Not on file  Intimate Partner Violence: Not on file    Family History  Problem Relation Age of Onset   COPD Mother    Brain cancer Father    Heart attack Maternal Grandmother    COPD Brother     Review of Systems:  As stated in the HPI and otherwise negative.   BP 120/70    Pulse 62    Ht 5' (1.524 m)    Wt 116 lb 6.4 oz (52.8 kg)    SpO2 98%    BMI 22.73 kg/m   Physical Examination: General: Well developed, well nourished, NAD  HEENT: OP clear, mucus membranes moist  SKIN: warm, dry. No rashes. Neuro: No focal deficits  Musculoskeletal: Muscle strength 5/5 all ext  Psychiatric: Mood and affect normal  Neck: No JVD, no carotid bruits, no thyromegaly, no lymphadenopathy.  Lungs:Clear bilaterally, no wheezes, rhonci, crackles Cardiovascular: Regular rate and rhythm. No murmurs, gallops or rubs. Abdomen:Soft. Bowel sounds present. Non-tender.  Extremities: No lower extremity edema. Pulses are 2 + in the bilateral DP/PT.  EKG:  EKG is ordered today. The ekg ordered today demonstrates sinus  Recent Labs: No results found for requested labs within last 8760 hours.   Lipid Panel No results found for: CHOL, TRIG, HDL, CHOLHDL, VLDL, LDLCALC, LDLDIRECT   Wt Readings from Last 3 Encounters:  04/26/21 116 lb 6.4 oz (52.8 kg)  04/17/20 122 lb 12.8 oz (55.7 kg)  04/14/19 134 lb 6.4 oz (61 kg)     Other studies Reviewed: Additional studies/ records that were reviewed today include: . Review of the above records demonstrates:    Assessment and Plan:   1. Non-sustained VT: Normal LV systolic function by  echo in 9629. No ischemia on stress test in 2020. She has no recent palpitations, near syncope or syncope. Will continue beta blocker.   Current medicines are reviewed at length with the patient today.  The patient does not have concerns regarding medicines.  The following changes have been made:  no change  Labs/ tests ordered today include:   Orders Placed This Encounter  Procedures   EKG 12-Lead     Disposition:   F/U with me in 12 months   Signed,  Verne Carrow, MD 04/26/2021 10:47 AM    [facility] [address] [place], [place], [place]  [address] Phone: [phone]; Fax: [phone]

## 2021-08-22 NOTE — Progress Notes (Deleted)
Office Visit Note  Patient: Alexis Barajas             Date of Birth: 11-06-1955           MRN: [id number]             PCP: Kaleen Mask, MD Referring: Kaleen Mask, * Visit Date: 08/28/2021 Occupation: @GUAROCC @  Subjective:  No chief complaint on file.   History of Present Illness: Alexis Barajas is a 66 y.o. female ***   Activities of Daily Living:  Patient reports morning stiffness for *** {minute/hour:19697}.   Patient {ACTIONS;DENIES/REPORTS:21021675::"Denies"} nocturnal pain.  Difficulty dressing/grooming: {ACTIONS;DENIES/REPORTS:21021675::"Denies"} Difficulty climbing stairs: {ACTIONS;DENIES/REPORTS:21021675::"Denies"} Difficulty getting out of chair: {ACTIONS;DENIES/REPORTS:21021675::"Denies"} Difficulty using hands for taps, buttons, cutlery, and/or writing: {ACTIONS;DENIES/REPORTS:21021675::"Denies"}  No Rheumatology ROS completed.   PMFS History:  Patient Active Problem List   Diagnosis Date Noted   Postoperative abdominal pain 05/17/2014   Pelvic prolapse 05/16/2014    Past Medical History:  Diagnosis Date   Anxiety    no meds   Arthritis    hands, lower back - no meds   Depression    no meds   NSVT (nonsustained ventricular tachycardia)    Osteoporosis    SVD (spontaneous vaginal delivery)    x 3    Family History  Problem Relation Age of Onset   COPD Mother    Brain cancer Father    Heart attack Maternal Grandmother    COPD Brother    Past Surgical History:  Procedure Laterality Date   ABDOMINAL HYSTERECTOMY     ANTERIOR AND POSTERIOR REPAIR N/A 05/16/2014   Procedure: ANTERIOR (CYSTOCELE) AND POSTERIOR REPAIR (RECTOCELE);  Surgeon: 05/18/2014, MD;  Location: [facility];  Service: Gynecology;  Laterality: N/A;   BURCH PROCEDURE N/A 05/16/2014   Procedure: BURCH PROCEDURE;  Surgeon: 05/18/2014, MD;  Location: [facility];  Service: Gynecology;  Laterality: N/A;   EAR CYST EXCISION N/A 05/16/2014   Procedure: INCLUSION CYST REMOVAL;   Surgeon: 05/18/2014, MD;  Location: [facility];  Service: Gynecology;  Laterality: N/A;   LEG SURGERY     metal plate with screws   right leg surgery     TUBAL LIGATION     VAGINAL HYSTERECTOMY N/A 05/16/2014   Procedure: TOTAL VAGINAL HYSTECTOMY ;  Surgeon: 05/18/2014, MD;  Location: [facility];  Service: Gynecology;  Laterality: N/A;   WISDOM TOOTH EXTRACTION     Social History   Social History Narrative   Not on file    There is no immunization history on file for this patient.   Objective: Vital Signs: There were no vitals taken for this visit.   Physical Exam   Musculoskeletal Exam: ***  CDAI Exam: CDAI Score: -- Patient Global: --; Provider Global: -- Swollen: --; Tender: -- Joint Exam 08/28/2021   No joint exam has been documented for this visit   There is currently no information documented on the homunculus. Go to the Rheumatology activity and complete the homunculus joint exam.  Investigation: No additional findings.  Imaging: No results found.  Recent Labs: Lab Results  Component Value Date   WBC 6.4 04/06/2018   HGB 14.0 04/06/2018   PLT 184 04/06/2018   NA 140 04/06/2018   K 4.4 04/06/2018   CL 107 04/06/2018   CO2 22 04/06/2018   GLUCOSE 117 (H) 04/06/2018   BUN 11 04/06/2018   CREATININE 0.62 04/06/2018   BILITOT 0.8 04/06/2018   ALKPHOS 66 04/06/2018   AST  24 04/06/2018   ALT 15 04/06/2018   PROT 7.0 04/06/2018   ALBUMIN 3.9 04/06/2018   CALCIUM 9.3 04/06/2018   GFRAA >60 04/06/2018    Speciality Comments: No specialty comments available.  Procedures:  No procedures performed Allergies: Tramadol, Oxycodone, and Penicillins   Assessment / Plan:     Visit Diagnoses: Pain in both hands - 04/03/21: RF<10, anti-CCP 3, Plt 201, Hgb 13.4, CRP<1  Other fatigue  History of depression  Orders: No orders of the defined types were placed in this encounter.  No orders of the defined types were placed in this  encounter.   Face-to-face time spent with patient was *** minutes. Greater than 50% of time was spent in counseling and coordination of care.  Follow-Up Instructions: No follow-ups on file.   Gearldine Bienenstock, PA-C  Note - This record has been created using Dragon software.  Chart creation errors have been sought, but may not always  have been located. Such creation errors do not reflect on  the standard of medical care.

## 2021-08-28 ENCOUNTER — Ambulatory Visit: Payer: Medicare Other | Admitting: Rheumatology

## 2022-01-16 ENCOUNTER — Ambulatory Visit
Admission: RE | Admit: 2022-01-16 | Discharge: 2022-01-16 | Disposition: A | Discharge status: Home / Self Care | Payer: Medicare Other | Source: Ambulatory Visit | Attending: Family Medicine | Admitting: Family Medicine

## 2022-01-16 ENCOUNTER — Other Ambulatory Visit: Payer: Self-pay | Admitting: Family Medicine

## 2022-01-16 DIAGNOSIS — M810 Age-related osteoporosis without current pathological fracture: Secondary | ICD-10-CM

## 2022-01-16 DIAGNOSIS — M546 Pain in thoracic spine: Secondary | ICD-10-CM

## 2022-02-12 ENCOUNTER — Other Ambulatory Visit: Payer: Self-pay | Admitting: Obstetrics and Gynecology

## 2022-02-12 DIAGNOSIS — Z13820 Encounter for screening for osteoporosis: Secondary | ICD-10-CM

## 2022-03-26 ENCOUNTER — Other Ambulatory Visit: Payer: Self-pay | Admitting: Family Medicine

## 2022-03-26 DIAGNOSIS — R1011 Right upper quadrant pain: Secondary | ICD-10-CM

## 2022-04-15 ENCOUNTER — Ambulatory Visit
Admission: RE | Admit: 2022-04-15 | Discharge: 2022-04-15 | Disposition: A | Discharge status: Home / Self Care | Payer: Medicare Other | Source: Ambulatory Visit | Attending: Family Medicine | Admitting: Family Medicine

## 2022-04-15 DIAGNOSIS — R1011 Right upper quadrant pain: Secondary | ICD-10-CM

## 2022-04-29 ENCOUNTER — Other Ambulatory Visit: Payer: Self-pay

## 2022-04-29 ENCOUNTER — Encounter (HOSPITAL_COMMUNITY): Payer: Self-pay | Admitting: Emergency Medicine

## 2022-04-29 ENCOUNTER — Observation Stay (HOSPITAL_COMMUNITY)
Admission: EM | Admit: 2022-04-29 | Discharge: 2022-04-30 | Disposition: A | Discharge status: Home / Self Care | Payer: Medicare Other | Attending: Surgery | Admitting: Surgery

## 2022-04-29 DIAGNOSIS — K3589 Other acute appendicitis without perforation or gangrene: Principal | ICD-10-CM | POA: Insufficient documentation

## 2022-04-29 DIAGNOSIS — Z87891 Personal history of nicotine dependence: Secondary | ICD-10-CM | POA: Insufficient documentation

## 2022-04-29 DIAGNOSIS — R1011 Right upper quadrant pain: Secondary | ICD-10-CM | POA: Diagnosis present

## 2022-04-29 DIAGNOSIS — K37 Unspecified appendicitis: Secondary | ICD-10-CM | POA: Diagnosis present

## 2022-04-29 NOTE — ED Triage Notes (Signed)
Pt's husband reports pt started c/o right sided upper abdominal pain that started after dinner along w/ emesis.  Pt is experiencing emesis and pain (tearful) in triage.  Pt states "I have a bad gallbladder" and spouse stated she had a dinner of fried food.

## 2022-04-30 ENCOUNTER — Other Ambulatory Visit: Payer: Self-pay

## 2022-04-30 ENCOUNTER — Encounter (HOSPITAL_COMMUNITY)
Admission: EM | Disposition: A | Discharge status: Home / Self Care | Payer: Self-pay | Source: Home / Self Care | Attending: Emergency Medicine

## 2022-04-30 ENCOUNTER — Observation Stay (HOSPITAL_BASED_OUTPATIENT_CLINIC_OR_DEPARTMENT_OTHER): Payer: Medicare Other | Admitting: Anesthesiology

## 2022-04-30 ENCOUNTER — Observation Stay (HOSPITAL_COMMUNITY): Payer: Medicare Other | Admitting: Anesthesiology

## 2022-04-30 ENCOUNTER — Emergency Department (HOSPITAL_COMMUNITY): Payer: Medicare Other

## 2022-04-30 ENCOUNTER — Encounter (HOSPITAL_COMMUNITY): Payer: Self-pay

## 2022-04-30 DIAGNOSIS — K3580 Unspecified acute appendicitis: Secondary | ICD-10-CM | POA: Diagnosis not present

## 2022-04-30 DIAGNOSIS — K37 Unspecified appendicitis: Secondary | ICD-10-CM | POA: Diagnosis present

## 2022-04-30 HISTORY — PX: LAPAROSCOPIC APPENDECTOMY: SHX408

## 2022-04-30 LAB — URINALYSIS, ROUTINE W REFLEX MICROSCOPIC
Bilirubin Urine: NEGATIVE
Glucose, UA: NEGATIVE mg/dL
Hgb urine dipstick: NEGATIVE
Ketones, ur: NEGATIVE mg/dL
Nitrite: NEGATIVE
Protein, ur: NEGATIVE mg/dL
Specific Gravity, Urine: 1.01 (ref 1.005–1.030)
pH: 7 (ref 5.0–8.0)

## 2022-04-30 LAB — COMPREHENSIVE METABOLIC PANEL
ALT: 14 U/L (ref 0–44)
AST: 29 U/L (ref 15–41)
Albumin: 3.6 g/dL (ref 3.5–5.0)
Alkaline Phosphatase: 87 U/L (ref 38–126)
Anion gap: 10 (ref 5–15)
BUN: 6 mg/dL — ABNORMAL LOW (ref 8–23)
CO2: 23 mmol/L (ref 22–32)
Calcium: 9.1 mg/dL (ref 8.9–10.3)
Chloride: 105 mmol/L (ref 98–111)
Creatinine, Ser: 0.71 mg/dL (ref 0.44–1.00)
GFR, Estimated: >60 mL/min (ref >60)
Glucose, Bld: 119 mg/dL — ABNORMAL HIGH (ref 70–99)
Potassium: 3.6 mmol/L (ref 3.5–5.1)
Sodium: 138 mmol/L (ref 135–145)
Total Bilirubin: 0.6 mg/dL (ref 0.3–1.2)
Total Protein: 6.7 g/dL (ref 6.5–8.1)

## 2022-04-30 LAB — CBC
HCT: 39.6 % (ref 36.0–46.0)
Hemoglobin: 13 g/dL (ref 12.0–15.0)
MCH: 28.1 pg (ref 26.0–34.0)
MCHC: 32.8 g/dL (ref 30.0–36.0)
MCV: 85.7 fL (ref 80.0–100.0)
Platelets: 218 10*3/uL (ref 150–400)
RBC: 4.62 MIL/uL (ref 3.87–5.11)
RDW: 13.6 % (ref 11.5–15.5)
WBC: 9 10*3/uL (ref 4.0–10.5)
nRBC: 0 % (ref 0.0–0.2)

## 2022-04-30 LAB — LIPASE, BLOOD: Lipase: 41 U/L (ref 11–51)

## 2022-04-30 LAB — URINALYSIS, MICROSCOPIC (REFLEX): Bacteria, UA: NONE SEEN

## 2022-04-30 SURGERY — APPENDECTOMY, LAPAROSCOPIC
Anesthesia: General | Site: Abdomen

## 2022-04-30 MED ORDER — ONDANSETRON HCL 4 MG/2ML IJ SOLN
4.0000 mg | Freq: Once | INTRAMUSCULAR | Status: AC
  Administered 2022-04-30: 4 mg via INTRAVENOUS
  Filled 2022-04-30: qty 2

## 2022-04-30 MED ORDER — ROCURONIUM BROMIDE 10 MG/ML (PF) SYRINGE
PREFILLED_SYRINGE | INTRAVENOUS | Status: DC | PRN
  Administered 2022-04-30: 10 mg via INTRAVENOUS
  Administered 2022-04-30: 50 mg via INTRAVENOUS

## 2022-04-30 MED ORDER — ORAL CARE MOUTH RINSE: 15.0000 mL | Freq: Once | OROMUCOSAL | Status: AC

## 2022-04-30 MED ORDER — METRONIDAZOLE 500 MG/100ML IV SOLN
500.0000 mg | Freq: Once | INTRAVENOUS | Status: AC
  Administered 2022-04-30: 500 mg via INTRAVENOUS
  Filled 2022-04-30: qty 100

## 2022-04-30 MED ORDER — PROPOFOL 10 MG/ML IV BOLUS
INTRAVENOUS | Status: AC
  Filled 2022-04-30: qty 20

## 2022-04-30 MED ORDER — ROCURONIUM BROMIDE 10 MG/ML (PF) SYRINGE
PREFILLED_SYRINGE | INTRAVENOUS | Status: AC
  Filled 2022-04-30: qty 10

## 2022-04-30 MED ORDER — HYDROMORPHONE HCL 1 MG/ML IJ SOLN
1.0000 mg | Freq: Once | INTRAMUSCULAR | Status: AC
Start: 2022-04-30 — End: 2022-04-30
  Administered 2022-04-30: 1 mg via INTRAVENOUS
  Filled 2022-04-30: qty 1

## 2022-04-30 MED ORDER — ONDANSETRON HCL 4 MG/2ML IJ SOLN
INTRAMUSCULAR | Status: DC | PRN
  Administered 2022-04-30: 4 mg via INTRAVENOUS

## 2022-04-30 MED ORDER — MORPHINE SULFATE (PF) 4 MG/ML IV SOLN
4.0000 mg | Freq: Once | INTRAVENOUS | Status: AC
  Administered 2022-04-30: 4 mg via INTRAVENOUS
  Filled 2022-04-30: qty 1

## 2022-04-30 MED ORDER — SODIUM CHLORIDE 0.9 % IR SOLN
Status: DC | PRN
  Administered 2022-04-30: 1000 mL

## 2022-04-30 MED ORDER — BUPIVACAINE-EPINEPHRINE 0.25% -1:200000 IJ SOLN
INTRAMUSCULAR | Status: DC | PRN
  Administered 2022-04-30: 15 mL

## 2022-04-30 MED ORDER — HYDROMORPHONE HCL 1 MG/ML IJ SOLN
1.0000 mg | INTRAMUSCULAR | Status: DC | PRN
  Administered 2022-04-30: 1 mg via INTRAVENOUS
  Filled 2022-04-30: qty 1

## 2022-04-30 MED ORDER — MIDAZOLAM HCL 2 MG/2ML IJ SOLN
INTRAMUSCULAR | Status: AC
  Filled 2022-04-30: qty 2

## 2022-04-30 MED ORDER — HYDROMORPHONE HCL 1 MG/ML IJ SOLN
0.2500 mg | INTRAMUSCULAR | Status: DC | PRN
  Administered 2022-04-30: 0.5 mg via INTRAVENOUS

## 2022-04-30 MED ORDER — LIDOCAINE 2% (20 MG/ML) 5 ML SYRINGE
INTRAMUSCULAR | Status: AC
  Filled 2022-04-30: qty 5

## 2022-04-30 MED ORDER — ONDANSETRON HCL 4 MG/2ML IJ SOLN
INTRAMUSCULAR | Status: AC
  Filled 2022-04-30: qty 2

## 2022-04-30 MED ORDER — DEXAMETHASONE SODIUM PHOSPHATE 10 MG/ML IJ SOLN
INTRAMUSCULAR | Status: AC
  Filled 2022-04-30: qty 1

## 2022-04-30 MED ORDER — 0.9 % SODIUM CHLORIDE (POUR BTL) OPTIME
TOPICAL | Status: DC | PRN
  Administered 2022-04-30: 1000 mL

## 2022-04-30 MED ORDER — LIDOCAINE 2% (20 MG/ML) 5 ML SYRINGE
INTRAMUSCULAR | Status: DC | PRN
  Administered 2022-04-30: 50 mg via INTRAVENOUS

## 2022-04-30 MED ORDER — SODIUM CHLORIDE 0.9 % IV SOLN
2.0000 g | Freq: Once | INTRAVENOUS | Status: AC
  Administered 2022-04-30: 2 g via INTRAVENOUS
  Filled 2022-04-30: qty 20

## 2022-04-30 MED ORDER — IOHEXOL 350 MG/ML SOLN
75.0000 mL | Freq: Once | INTRAVENOUS | Status: AC | PRN
  Administered 2022-04-30: 75 mL via INTRAVENOUS

## 2022-04-30 MED ORDER — LACTATED RINGERS IV SOLN: INTRAVENOUS | Status: DC

## 2022-04-30 MED ORDER — FENTANYL CITRATE (PF) 250 MCG/5ML IJ SOLN
INTRAMUSCULAR | Status: AC
  Filled 2022-04-30: qty 5

## 2022-04-30 MED ORDER — PHENYLEPHRINE HCL-NACL 20-0.9 MG/250ML-% IV SOLN
INTRAVENOUS | Status: DC | PRN
  Administered 2022-04-30: 25 ug/min via INTRAVENOUS

## 2022-04-30 MED ORDER — MIDAZOLAM HCL 5 MG/5ML IJ SOLN
INTRAMUSCULAR | Status: DC | PRN
  Administered 2022-04-30: 1 mg via INTRAVENOUS

## 2022-04-30 MED ORDER — SUGAMMADEX SODIUM 200 MG/2ML IV SOLN
INTRAVENOUS | Status: DC | PRN
  Administered 2022-04-30: 200 mg via INTRAVENOUS

## 2022-04-30 MED ORDER — ACETAMINOPHEN 10 MG/ML IV SOLN
1000.0000 mg | Freq: Once | INTRAVENOUS | Status: DC | PRN
  Administered 2022-04-30: 1000 mg via INTRAVENOUS

## 2022-04-30 MED ORDER — FENTANYL CITRATE (PF) 250 MCG/5ML IJ SOLN
INTRAMUSCULAR | Status: DC | PRN
  Administered 2022-04-30 (×5): 50 ug via INTRAVENOUS

## 2022-04-30 MED ORDER — PROPOFOL 10 MG/ML IV BOLUS
INTRAVENOUS | Status: DC | PRN
  Administered 2022-04-30: 90 mg via INTRAVENOUS

## 2022-04-30 MED ORDER — HYDROMORPHONE HCL 1 MG/ML IJ SOLN
INTRAMUSCULAR | Status: AC
  Filled 2022-04-30: qty 1

## 2022-04-30 MED ORDER — DEXAMETHASONE SODIUM PHOSPHATE 10 MG/ML IJ SOLN
INTRAMUSCULAR | Status: DC | PRN
  Administered 2022-04-30: 10 mg via INTRAVENOUS

## 2022-04-30 MED ORDER — HYDROMORPHONE HCL 1 MG/ML IJ SOLN
1.0000 mg | INTRAMUSCULAR | Status: DC | PRN
  Administered 2022-04-30: 2 mg via INTRAVENOUS
  Filled 2022-04-30: qty 2

## 2022-04-30 MED ORDER — ACETAMINOPHEN 10 MG/ML IV SOLN
INTRAVENOUS | Status: AC
  Filled 2022-04-30: qty 100

## 2022-04-30 MED ORDER — CHLORHEXIDINE GLUCONATE 0.12 % MT SOLN
15.0000 mL | Freq: Once | OROMUCOSAL | Status: AC
  Administered 2022-04-30: 15 mL via OROMUCOSAL
  Filled 2022-04-30: qty 15

## 2022-04-30 MED ORDER — HYDROMORPHONE HCL 1 MG/ML IJ SOLN
INTRAMUSCULAR | Status: AC
  Administered 2022-04-30: 0.5 mg via INTRAVENOUS
  Filled 2022-04-30: qty 1

## 2022-04-30 MED ORDER — BUPIVACAINE-EPINEPHRINE (PF) 0.25% -1:200000 IJ SOLN
INTRAMUSCULAR | Status: AC
  Filled 2022-04-30: qty 30

## 2022-04-30 SURGICAL SUPPLY — 48 items
ADH SKN CLS APL DERMABOND .7 (GAUZE/BANDAGES/DRESSINGS) ×1
APL PRP STRL LF DISP 70% ISPRP (MISCELLANEOUS) ×1
APPLIER CLIP 5 13 M/L LIGAMAX5 (MISCELLANEOUS)
APR CLP MED LRG 5 ANG JAW (MISCELLANEOUS)
BAG COUNTER SPONGE SURGICOUNT (BAG) ×1 IMPLANT
BAG SPNG CNTER NS LX DISP (BAG) ×1
BLADE CLIPPER SURG (BLADE) IMPLANT
CANISTER SUCT 3000ML PPV (MISCELLANEOUS) ×1 IMPLANT
CHLORAPREP W/TINT 26 (MISCELLANEOUS) ×1 IMPLANT
CLIP APPLIE 5 13 M/L LIGAMAX5 (MISCELLANEOUS) IMPLANT
COVER SURGICAL LIGHT HANDLE (MISCELLANEOUS) ×1 IMPLANT
CUTTER FLEX LINEAR 45M (STAPLE) ×1 IMPLANT
DERMABOND ADVANCED .7 DNX12 (GAUZE/BANDAGES/DRESSINGS) ×1 IMPLANT
ELECT REM PT RETURN 9FT ADLT (ELECTROSURGICAL) ×1
ELECTRODE REM PT RTRN 9FT ADLT (ELECTROSURGICAL) ×1 IMPLANT
GLOVE BIO SURGEON STRL SZ7.5 (GLOVE) ×1 IMPLANT
GLOVE INDICATOR 8.0 STRL GRN (GLOVE) ×1 IMPLANT
GOWN STRL REUS W/ TWL LRG LVL3 (GOWN DISPOSABLE) ×2 IMPLANT
GOWN STRL REUS W/ TWL XL LVL3 (GOWN DISPOSABLE) ×1 IMPLANT
GOWN STRL REUS W/TWL LRG LVL3 (GOWN DISPOSABLE) ×2
GOWN STRL REUS W/TWL XL LVL3 (GOWN DISPOSABLE) ×1
IRRIG SUCT STRYKERFLOW 2 WTIP (MISCELLANEOUS) ×1
IRRIGATION SUCT STRKRFLW 2 WTP (MISCELLANEOUS) ×1 IMPLANT
KIT BASIN OR (CUSTOM PROCEDURE TRAY) ×1 IMPLANT
KIT TURNOVER KIT B (KITS) ×1 IMPLANT
NS IRRIG 1000ML POUR BTL (IV SOLUTION) ×1 IMPLANT
PAD ARMBOARD 7.5X6 YLW CONV (MISCELLANEOUS) ×2 IMPLANT
PENCIL SMOKE EVACUATOR (MISCELLANEOUS) ×1 IMPLANT
RELOAD 45 VASCULAR/THIN (ENDOMECHANICALS) IMPLANT
RELOAD STAPLE 45 2.5 WHT GRN (ENDOMECHANICALS) IMPLANT
RELOAD STAPLE 45 3.5 BLU ETS (ENDOMECHANICALS) IMPLANT
RELOAD STAPLE TA45 3.5 REG BLU (ENDOMECHANICALS) ×1 IMPLANT
SCISSORS LAP 5X35 DISP (ENDOMECHANICALS) IMPLANT
SET TUBE SMOKE EVAC HIGH FLOW (TUBING) ×1 IMPLANT
SHEARS HARMONIC ACE PLUS 36CM (ENDOMECHANICALS) ×1 IMPLANT
SLEEVE ADV FIXATION 5X100MM (TROCAR) ×1 IMPLANT
SPECIMEN JAR SMALL (MISCELLANEOUS) ×1 IMPLANT
SUT MNCRL AB 4-0 PS2 18 (SUTURE) ×1 IMPLANT
SYS BAG RETRIEVAL 10MM (BASKET) ×1
SYSTEM BAG RETRIEVAL 10MM (BASKET) ×1 IMPLANT
TOWEL GREEN STERILE (TOWEL DISPOSABLE) ×1 IMPLANT
TOWEL GREEN STERILE FF (TOWEL DISPOSABLE) ×1 IMPLANT
TRAY FOLEY W/BAG SLVR 16FR (SET/KITS/TRAYS/PACK) ×1
TRAY FOLEY W/BAG SLVR 16FR ST (SET/KITS/TRAYS/PACK) ×1 IMPLANT
TRAY LAPAROSCOPIC MC (CUSTOM PROCEDURE TRAY) ×1 IMPLANT
TROCAR ADV FIXATION 5X100MM (TROCAR) ×1 IMPLANT
TROCAR BALLN 12MMX100 BLUNT (TROCAR) ×1 IMPLANT
WATER STERILE IRR 1000ML POUR (IV SOLUTION) ×1 IMPLANT

## 2022-04-30 NOTE — Anesthesia Preprocedure Evaluation (Addendum)
Anesthesia Evaluation  Patient identified by MRN, date of birth, ID band Patient awake    Reviewed: Allergy & Precautions, NPO status , Patient's Chart, lab work & pertinent test results  Airway Mallampati: II  TM Distance: >3 FB Neck ROM: Full    Dental no notable dental hx.    Pulmonary former smoker   Pulmonary exam normal        Cardiovascular hypertension, Pt. on medications and Pt. on home beta blockers  Rhythm:Regular Rate:Normal     Neuro/Psych   Anxiety Depression    negative neurological ROS     GI/Hepatic Neg liver ROS,,,Appendicitis    Endo/Other  negative endocrine ROS    Renal/GU negative Renal ROS  negative genitourinary   Musculoskeletal  (+) Arthritis , Osteoarthritis,    Abdominal Normal abdominal exam  (+)   Peds  Hematology negative hematology ROS (+)   Anesthesia Other Findings   Reproductive/Obstetrics                             Anesthesia Physical Anesthesia Plan  ASA: 2  Anesthesia Plan: General   Post-op Pain Management:    Induction: Intravenous  PONV Risk Score and Plan: 3 and Ondansetron, Dexamethasone and Treatment may vary due to age or medical condition  Airway Management Planned: Mask and Oral ETT  Additional Equipment: None  Intra-op Plan:   Post-operative Plan: Extubation in OR  Informed Consent: I have reviewed the patients History and Physical, chart, labs and discussed the procedure including the risks, benefits and alternatives for the proposed anesthesia with the patient or authorized representative who has indicated his/her understanding and acceptance.     Dental advisory given  Plan Discussed with: CRNA  Anesthesia Plan Comments: (Lab Results      Component                Value               Date                      WBC                      9.0                 04/29/2022                HGB                      13.0                 04/29/2022                HCT                      39.6                04/29/2022                MCV                      85.7                04/29/2022                PLT  218                 04/29/2022             Lab Results      Component                Value               Date                      NA                       138                 04/29/2022                K                        3.6                 04/29/2022                CO2                      23                  04/29/2022                GLUCOSE                  119 (H)             04/29/2022                BUN                      6 (L)               04/29/2022                CREATININE               0.71                04/29/2022                CALCIUM                  9.1                 04/29/2022                GFRNONAA                 >60                 04/29/2022           )       Anesthesia Quick Evaluation

## 2022-04-30 NOTE — ED Notes (Signed)
Patient transported to CT 

## 2022-04-30 NOTE — Op Note (Signed)
Baraa Labarr Agresti [id number]   PRE-OPERATIVE DIAGNOSIS:  Acute appendicitis  POST-OPERATIVE DIAGNOSIS:  Acute appendicitis without perforation or abscess  PROCEDURE: Laparoscopic appendectomy  SURGEON:  Sharon Mt. Jadrien Narine, M.D.  ASSISTANT: OR staff  ANESTHESIA: General endotracheal  EBL:   10 mL  DRAINS: None  SPECIMEN:  Appendix  COUNTS:  Sponge, needle and instrument counts were reported correct x2 at conclusion of the operation  DISPOSITION:  PACU in satisfactory condition  COMPLICATIONS: None  FINDINGS: Acutely inflamed appendix  DESCRIPTION:  The patient was identified & brought into the operating room. SCDs were in place and functioning. General endotracheal anesthesia was administered. Preoperative antibiotics were administered. The patient was positioned supine with left arm tucked. Hair on the abdomen was then clipped by the OR team. A foley catheter was inserted under sterile conditions. The abdomen was prepped and draped in the standard sterile fashion. A surgical timeout confirmed our plan.  A small incision was made in the supraumbilical skin. The subcutaneous tissue was dissected and the umbilical stalk identified. The stalk was grasped with a Kocher and retracted outwardly. The supraumbilical fascia was exposed and incised. Peritoneal entry was carefully made bluntly. A 0 Vicryl purse-string suture was placed and then the [facility] port was introduced into the abdomen.  CO2 insufflation commenced to 60mHg. The laparoscope was inserted and confirmed no evidence of trocar site complications. The patient was then positioned in Trendelenburg. Two additional ports were placed - one in left lower quadrant and another in the suprapubic midline taking care to stay well above the bladder - 3 fingerbreadths above the pubic symphysis. The bed was then slightly tilted to place the left side down.  Omental adhesions across the lower midline are cleared to the extent necessary to see the  appendix; this was done sharply. The appendix was identified and attachments to the appendix to the surrounding tissues were freed without difficulty.  The appendix was elevated.  The base of the appendix was circumferentially dissected taking care to preserve the cecum free of injury. The base was noted to be viable and healthy appearing. The terminal ileum, cecum and ascending colon also appeared normal. The base of the appendix was then stapled with a blue load, taking a small healthy cuff of viable cecum, taking care to stay clear of the ileocecal valve. The mesoappendix was then ligated by "hugging" the appendix using the harmonic scalpel. The mesoappendix was inspected and noted to be hemostatic. The appendix was placed in an EndoBag.  The right lower quadrant was conservatively irrigated. Hemostasis was noted to be achieved - taking time to inspect the ligated mesoappendix, colon mesentery, and retroperitoneum. Staple line was noted to be intact on the cecum with no bleeding. There was no perforation or injury. The right lower quadrant appeared clean and as such, no drain was placed.  The left lower quadrant and suprapubic ports were removed under direct visualization. The EndoBag was then removed through the umbilical port site and passed off as specimen. The CO2 was exhausted from the abdomen. The umbilical fascia was then closed by closing the 0 Vicryl suture. The fascia was palpated and noted to be completely closed. The skin of all port sites was then approximated using 4-0 Monocryl suture. The incisions were covered with Dermabond.  She was then awakened from general anesthesia, extubated, and transferred to a stretcher for transport to recovery in satisfactory condition.

## 2022-04-30 NOTE — ED Provider Notes (Signed)
[facility] Provider Note   CSN: [id number] Arrival date & time: 04/29/22  2324     History  Chief Complaint  Patient presents with   Abdominal Pain   Emesis    Alexis Barajas is a 67 y.o. female.  The history is provided by the patient, medical records and the spouse.  Abdominal Pain Associated symptoms: vomiting   Emesis Associated symptoms: abdominal pain    67 y.o. F with hx of anxiety, depression, osteoporosis, newly diagnosed gallstones, presenting to the ED with RUQ pain.  States she got diagnosed with gallstones earlier in the month, is actually scheduled to see general surgery next week about cholecystectomy.  Tonight she had fried cube steak, fried potatoes for dinner and afterwards starting having severe pain.  She reports some nausea/vomiting.  No fever.  No diarrhea.  Takes oxycodone chronically, no relief.  Home Medications Prior to Admission medications   Medication Sig Start Date End Date Taking? Authorizing Provider  Jearl Klinefelter ELLIPTA 62.5-25 MCG/INH AEPB Inhale 1 puff by mouth once daily 01/18/20   Noemi Chapel P, DO  buPROPion (WELLBUTRIN XL) 150 MG 24 hr tablet Take 1 mg by mouth daily. 04/06/20   [provider]  calcium carbonate (OS-CAL) 600 MG TABS tablet Take 600 mg by mouth daily.    [provider]  Cholecalciferol (VITAMIN D3 PO) Take 1,000 mg by mouth daily.    [provider]  metoprolol succinate (TOPROL-XL) 25 MG 24 hr tablet Take 1 tablet (25 mg total) by mouth daily. Per Dr. Angelena Form OK to fill, pt not due to be seen again until 2025. 04/26/21   Burnell Blanks, MD  oxyCODONE-acetaminophen (PERCOCET/ROXICET) 5-325 MG tablet Take 2 tablets by mouth every 6 (six) hours as needed. 02/05/17   Ward, Delice Bison, DO  VENTOLIN HFA 108 (90 Base) MCG/ACT inhaler  12/06/18   [provider]  VITAMIN K PO Take 700 mg by mouth daily.    [provider]      Allergies     Tramadol, Oxycodone, and Penicillins    Review of Systems   Review of Systems  Gastrointestinal:  Positive for abdominal pain and vomiting.  All other systems reviewed and are negative.   Physical Exam Updated Vital Signs BP 130/74 (BP Location: Right Arm)   Pulse 85   Temp 98.2 F (36.8 C) (Oral)   Resp 17   Ht 5' (1.524 m)   Wt 52.8 kg   SpO2 99%   BMI 22.73 kg/m   Physical Exam Vitals and nursing note reviewed.  Constitutional:      Appearance: She is well-developed.     Comments: Uncomfortable appearing  HENT:     Head: Normocephalic and atraumatic.  Eyes:     Conjunctiva/sclera: Conjunctivae normal.     Pupils: Pupils are equal, round, and reactive to light.  Cardiovascular:     Rate and Rhythm: Normal rate and regular rhythm.     Heart sounds: Normal heart sounds.  Pulmonary:     Effort: Pulmonary effort is normal.     Breath sounds: Normal breath sounds.  Abdominal:     General: Bowel sounds are normal.     Palpations: Abdomen is soft.     Tenderness: There is abdominal tenderness in the right upper quadrant.     Comments: RUQ tenderness, voluntary guarding  Musculoskeletal:        General: Normal range of motion.     Cervical  back: Normal range of motion.  Skin:    General: Skin is warm and dry.  Neurological:     Mental Status: She is alert and oriented to person, place, and time.     ED Results / Procedures / Treatments   Labs (all labs ordered are listed, but only abnormal results are displayed) Labs Reviewed  COMPREHENSIVE METABOLIC PANEL - Abnormal; Notable for the following components:      Result Value   Glucose, Bld 119 (*)    BUN 6 (*)    All other components within normal limits  LIPASE, BLOOD  CBC  URINALYSIS, ROUTINE W REFLEX MICROSCOPIC    EKG None  Radiology CT ABDOMEN PELVIS W CONTRAST  Result Date: 04/30/2022 CLINICAL DATA:  Right-sided abdominal pain. EXAM: CT ABDOMEN AND PELVIS WITH CONTRAST TECHNIQUE: Multidetector  CT imaging of the abdomen and pelvis was performed using the standard protocol following bolus administration of intravenous contrast. RADIATION DOSE REDUCTION: This exam was performed according to the departmental dose-optimization program which includes automated exposure control, adjustment of the mA and/or kV according to patient size and/or use of iterative reconstruction technique. CONTRAST:  68m OMNIPAQUE IOHEXOL 350 MG/ML SOLN COMPARISON:  September 13, 2017 FINDINGS: Lower chest: Mild lingular and mild right middle lobe atelectatic changes are seen. Hepatobiliary: A stable 3.9 cm x 1.9 cm lobulated hypervascular lesion is seen within the left lobe of the liver, extending in between branches of the portal vein and left hepatic vein. Additional stable 3 mm and 7 mm foci of parenchymal low attenuation are seen within the liver dome and anterolateral aspect of the left lobe of the liver, along the falciform ligament (axial CT images 5 and 18, CT series 3). The gallbladder is moderately distended, without evidence of gallstones, gallbladder wall thickening or pericholecystic inflammation. Pancreas: Unremarkable. No pancreatic ductal dilatation or surrounding inflammatory changes. Spleen: Normal in size without focal abnormality. Adrenals/Urinary Tract: Adrenal glands are unremarkable. Kidneys are normal in size, without obstructing renal calculi or hydronephrosis. A 5.1 cm x 5.0 cm calyceal diverticulum is seen within the mid left kidney. This contains numerous subcentimeter renal calculi and is decreased in size when compared to the prior study. Bladder is unremarkable. Stomach/Bowel: Stomach is within normal limits. 6 mm appendicoliths are seen within the base of a dilated (approximately 9 mm diameter), predominant fluid-filled appendix (axial CT images 48 through 54, CT series 3). A small amount of air is seen within the tip of the appendix. Stool is noted throughout a large portion of the colon. No evidence of  bowel dilatation. Vascular/Lymphatic: Aortic atherosclerosis. A stable, likely benign 14 mm diameter cystic appearing structure is seen along the posterior aspect of the right iliac vessels (axial CT image 49, CT series 3). No enlarged abdominal or pelvic lymph nodes. Reproductive: Status post hysterectomy. The bilateral adnexa are unremarkable. Other: No abdominal wall hernia or abnormality. No abdominopelvic ascites. Musculoskeletal: Chronic loss of vertebral body height is seen involving the T11, T12, L1, L2, L3 and L4 vertebral bodies. Findings involving the L3 and L4 vertebral bodies are new when compared to the prior study. IMPRESSION: 1. Findings likely consistent with early appendicitis. 2. 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 Correlation with nonemergent hepatic MRI is recommended. 3. 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 4. Multiple chronic compression fracture deformities within the lower thoracic spine and lumbar spine, as described above. 5. Aortic atherosclerosis. Aortic Atherosclerosis (ICD10-I70.0). Electronically Signed  By: Virgina Norfolk M.D.   On: 04/30/2022 03:31   US Abdomen Limited RUQ (LIVER/GB)  Result Date: 04/30/2022 CLINICAL DATA:  Right upper quadrant pain. EXAM: ULTRASOUND ABDOMEN LIMITED RIGHT UPPER QUADRANT COMPARISON:  04/15/2022. FINDINGS: Gallbladder: No gallstones or wall thickening visualized. No sonographic Murphy sign noted by sonographer. Common bile duct: Diameter: 5.9 mm. Liver: No focal lesion identified. Within normal limits in parenchymal echogenicity. Portal vein is patent on color Doppler imaging with normal direction of blood flow towards the liver. Other: No free fluid. IMPRESSION: Normal examination. Electronically Signed   By: Brett Fairy M.D.   On: 04/30/2022 02:07    Procedures Procedures    Medications Ordered in ED Medications  HYDROmorphone  (DILAUDID) injection 1 mg (has no administration in time range)  cefTRIAXone (ROCEPHIN) 2 g in sodium chloride 0.9 % 100 mL IVPB (has no administration in time range)    And  metroNIDAZOLE (FLAGYL) IVPB 500 mg (has no administration in time range)  morphine (PF) 4 MG/ML injection 4 mg (4 mg Intravenous Given 04/30/22 0117)  ondansetron (ZOFRAN) injection 4 mg (4 mg Intravenous Given 04/30/22 0114)  HYDROmorphone (DILAUDID) injection 1 mg (1 mg Intravenous Given 04/30/22 0228)  iohexol (OMNIPAQUE) 350 MG/ML injection 75 mL (75 mLs Intravenous Contrast Given 04/30/22 0301)    ED Course/ Medical Decision Making/ A&P                             Medical Decision Making Amount and/or Complexity of Data Reviewed Labs: ordered. Radiology: ordered and independent interpretation performed. ECG/medicine tests: ordered and independent interpretation performed.  Risk Prescription drug management. Decision regarding hospitalization.   67 y.o. F here with abdominal pain.  Recent diagnosis of gallstones, ate fried steak and potatoes for dinner.  She is afebrile and nontoxic, but does appear uncomfortable.  She has tenderness in the right upper quadrant with voluntary guarding.  Labs were sent from triage--no leukocytosis, no electrolyte derangement.  Normal lipase.  Right upper quadrant ultrasound obtained which is negative.  Reviewed her scan from 04/15/2022, did have apparent gallstones on that study.  Pain still fairly uncontrolled after morphine, given dilaudid.  Will obtain CT scan.  3:39 AM CT with findings of early appendicitis.  On re-check she appears more comfortable.  Seems to have some periumbilical pain now.  No further emesis.  Given additional IV dilaudid, abx.  Will discuss with general surgery.  Discussed with Dr. Rosendo Gros-- will place temporary orders, likely OR later today.  Final Clinical Impression(s) / ED Diagnoses Final diagnoses:  Other acute appendicitis    Rx / DC Orders ED  Discharge Orders     None         Larene Pickett, PA-C 04/30/22 [id number]    Maudie Flakes, MD 04/30/22 [phone]

## 2022-04-30 NOTE — Progress Notes (Signed)
66yoF with acute appendicitis. Case reviewed and discussed with Dr. Rosendo Gros this morning whom is off-going from call.   +tenderness to palpation in RLQ of abdomen, +Rovsing's; nondistended CT reviewed as well - fecaliths with 9 mm fluid filled appendix   -The anatomy and physiology of the GI tract was discussed at length with the patient. The pathophysiology of appendicitis was discussed as well. -We reviewed options moving forward for treatment, covering IV abx vs surgery. We discussed that with antibiotics alone, there is reasonable success in managing appendicitis, however, risks of recurrence at 16yr being as high as 40% in some studies. We discussed and recommended appendectomy given fecaliths - laparoscopic and potential open techniques as well as scenarios where an ileocecectomy could be necessary. We discussed the material risks (including, but not limited to, pain, bleeding, infection, scarring, 2 for blood transfusion, damage to surrounding structures- blood vessels/nerves/viscus/organs, damage to ureter/bladder, urine leak, leak from staple line, need for additional procedures, hernia, recurrence although quite low, pneumonia, heart attack, stroke, death) benefits and alternatives to surgery were discussed. The patient's questions were answered to her satisfaction, she voiced understanding and elected to proceed with surgery. Additionally, we discussed typical postoperative expectations and the recovery process.  CNadeen Landau MD [facility], [facility]

## 2022-04-30 NOTE — Anesthesia Procedure Notes (Signed)
Procedure Name: Intubation Date/Time: 04/30/2022 8:10 AM  Performed by: Kyung Rudd, CRNAPre-anesthesia Checklist: Patient identified, Emergency Drugs available, Suction available and Patient being monitored Patient Re-evaluated:Patient Re-evaluated prior to induction Oxygen Delivery Method: Circle system utilized Preoxygenation: Pre-oxygenation with 100% oxygen Induction Type: IV induction Ventilation: Mask ventilation without difficulty Laryngoscope Size: Mac and 3 Grade View: Grade II Tube type: Oral Tube size: 7.0 mm Number of attempts: 1 Airway Equipment and Method: Stylet Placement Confirmation: ETT inserted through vocal cords under direct vision, positive ETCO2 and breath sounds checked- equal and bilateral Secured at: 21 cm Tube secured with: Tape Dental Injury: Teeth and Oropharynx as per pre-operative assessment

## 2022-04-30 NOTE — H&P (Signed)
Alexis Barajas is an 67 y.o. female.   Chief Complaint: abd pain HPI: PT [name] with abd pain yesterday. Pt has been seen in the office and had [place] thinking there was a GB component to her pain in the past.  Pt states the pain has transitioned since being here to the RLQ.  She states she had n/v at home and chills.  CT and US performed in ED and shows no GS and early acute appendicitis.  Pt with no elev WBC.  I reviewed all studies.  Surgery was consulted for further eval and mgmt  Past Medical History:  Diagnosis Date   Anxiety    no meds   Arthritis    hands, lower back - no meds   Depression    no meds   NSVT (nonsustained ventricular tachycardia) (HCC)    Osteoporosis    SVD (spontaneous vaginal delivery)    x 3    Past Surgical History:  Procedure Laterality Date   ABDOMINAL HYSTERECTOMY     ANTERIOR AND POSTERIOR REPAIR N/A 05/16/2014   Procedure: ANTERIOR (CYSTOCELE) AND POSTERIOR REPAIR (RECTOCELE);  Surgeon: Ena Dawley, MD;  Location: [facility];  Service: Gynecology;  Laterality: N/A;   BURCH PROCEDURE N/A 05/16/2014   Procedure: BURCH PROCEDURE;  Surgeon: Ena Dawley, MD;  Location: [facility];  Service: Gynecology;  Laterality: N/A;   EAR CYST EXCISION N/A 05/16/2014   Procedure: INCLUSION CYST REMOVAL;  Surgeon: Ena Dawley, MD;  Location: [facility];  Service: Gynecology;  Laterality: N/A;   LEG SURGERY     metal plate with screws   right leg surgery     TUBAL LIGATION     VAGINAL HYSTERECTOMY N/A 05/16/2014   Procedure: TOTAL VAGINAL HYSTECTOMY ;  Surgeon: Ena Dawley, MD;  Location: [facility];  Service: Gynecology;  Laterality: N/A;   WISDOM TOOTH EXTRACTION      Family History  Problem Relation Age of Onset   COPD Mother    Brain cancer Father    Heart attack Maternal Grandmother    COPD Brother    Social History:  reports that she quit smoking about 34 years ago. Her smoking use included cigarettes. She has a 18.00 pack-year smoking history. She has  never used smokeless tobacco. She reports that she does not drink alcohol and does not use drugs.  Allergies:  Allergies  Allergen Reactions   Tramadol Itching   Oxycodone Rash    "Patient is allergic to narcotic pain medication"   Penicillins Rash    Has patient had a PCN reaction causing immediate rash, facial/tongue/throat swelling, SOB or lightheadedness with hypotension: Yes Has patient had a PCN reaction causing severe rash involving mucus membranes or skin necrosis: No Has patient had a PCN reaction that required hospitalization: No Has patient had a PCN reaction occurring within the last 10 years: No If all of the above answers are "NO", then may proceed with Cephalosporin use.     (Not in a hospital admission)   Results for orders placed or performed during the hospital encounter of 04/29/22 (from the past 48 hour(s))  Lipase, blood     Status: None   Collection Time: 04/29/22 11:57 PM  Result Value Ref Range   Lipase 41 11 - 51 U/L    Comment: Performed at [facility], [address], [place], [place] [address]  Comprehensive metabolic panel     Status: Abnormal   Collection Time: 04/29/22 11:57 PM  Result Value Ref Range  Sodium 138 135 - 145 mmol/L   Potassium 3.6 3.5 - 5.1 mmol/L   Chloride 105 98 - 111 mmol/L   CO2 23 22 - 32 mmol/L   Glucose, Bld 119 (H) 70 - 99 mg/dL    Comment: Glucose reference range applies only to samples taken after fasting for at least 8 hours.   BUN 6 (L) 8 - 23 mg/dL   Creatinine, Ser 0.71 0.44 - 1.00 mg/dL   Calcium 9.1 8.9 - 10.3 mg/dL   Total Protein 6.7 6.5 - 8.1 g/dL   Albumin 3.6 3.5 - 5.0 g/dL   AST 29 15 - 41 U/L   ALT 14 0 - 44 U/L   Alkaline Phosphatase 87 38 - 126 U/L   Total Bilirubin 0.6 0.3 - 1.2 mg/dL   GFR, Estimated >60 >60 mL/min    Comment: (NOTE) Calculated using the CKD-EPI Creatinine Equation (2021)    Anion gap 10 5 - 15    Comment: Performed at [facility] [address],  [place], [place] [address]  CBC     Status: None   Collection Time: 04/29/22 11:57 PM  Result Value Ref Range   WBC 9.0 4.0 - 10.5 K/uL   RBC 4.62 3.87 - 5.11 MIL/uL   Hemoglobin 13.0 12.0 - 15.0 g/dL   HCT 39.6 36.0 - 46.0 %   MCV 85.7 80.0 - 100.0 fL   MCH 28.1 26.0 - 34.0 pg   MCHC 32.8 30.0 - 36.0 g/dL   RDW 13.6 11.5 - 15.5 %   Platelets 218 150 - 400 K/uL   nRBC 0.0 0.0 - 0.2 %    Comment: Performed at [facility], [place] [address] [place], [place] [address]  Urinalysis, Routine w reflex microscopic -Urine, Clean Catch     Status: Abnormal   Collection Time: 04/30/22  4:47 AM  Result Value Ref Range   Color, Urine YELLOW YELLOW   APPearance CLEAR CLEAR   Specific Gravity, Urine 1.010 1.005 - 1.030   pH 7.0 5.0 - 8.0   Glucose, UA NEGATIVE NEGATIVE mg/dL   Hgb urine dipstick NEGATIVE NEGATIVE   Bilirubin Urine NEGATIVE NEGATIVE   Ketones, ur NEGATIVE NEGATIVE mg/dL   Protein, ur NEGATIVE NEGATIVE mg/dL   Nitrite NEGATIVE NEGATIVE   Leukocytes,Ua TRACE (A) NEGATIVE    Comment: Performed at [facility] [address] [place], [place] [address]  Urinalysis, Microscopic (reflex)     Status: None   Collection Time: 04/30/22  4:47 AM  Result Value Ref Range   RBC / HPF 0-5 0 - 5 RBC/hpf   WBC, UA 0-5 0 - 5 WBC/hpf   Bacteria, UA NONE SEEN NONE SEEN   Squamous Epithelial / HPF 0-5 0 - 5 /HPF    Comment: Performed at [facility], [place] [address] [place], [place] [address]   CT ABDOMEN PELVIS W CONTRAST  Result Date: 04/30/2022 CLINICAL DATA:  Right-sided abdominal pain. EXAM: CT ABDOMEN AND PELVIS WITH CONTRAST TECHNIQUE: Multidetector CT imaging of the abdomen and pelvis was performed using the standard protocol following bolus administration of intravenous contrast. RADIATION DOSE REDUCTION: This exam was performed according to the departmental dose-optimization program which includes automated exposure control, adjustment of the mA and/or kV according to  patient size and/or use of iterative reconstruction technique. CONTRAST:  82m OMNIPAQUE IOHEXOL 350 MG/ML SOLN COMPARISON:  September 13, 2017 FINDINGS: Lower chest: Mild lingular and mild right middle lobe atelectatic changes are  seen. Hepatobiliary: A stable 3.9 cm x 1.9 cm lobulated hypervascular lesion is seen within the left lobe of the liver, extending in between branches of the portal vein and left hepatic vein. Additional stable 3 mm and 7 mm foci of parenchymal low attenuation are seen within the liver dome and anterolateral aspect of the left lobe of the liver, along the falciform ligament (axial CT images 5 and 18, CT series 3). The gallbladder is moderately distended, without evidence of gallstones, gallbladder wall thickening or pericholecystic inflammation. Pancreas: Unremarkable. No pancreatic ductal dilatation or surrounding inflammatory changes. Spleen: Normal in size without focal abnormality. Adrenals/Urinary Tract: Adrenal glands are unremarkable. Kidneys are normal in size, without obstructing renal calculi or hydronephrosis. A 5.1 cm x 5.0 cm calyceal diverticulum is seen within the mid left kidney. This contains numerous subcentimeter renal calculi and is decreased in size when compared to the prior study. Bladder is unremarkable. Stomach/Bowel: Stomach is within normal limits. 6 mm appendicoliths are seen within the base of a dilated (approximately 9 mm diameter), predominant fluid-filled appendix (axial CT images 48 through 54, CT series 3). A small amount of air is seen within the tip of the appendix. Stool is noted throughout a large portion of the colon. No evidence of bowel dilatation. Vascular/Lymphatic: Aortic atherosclerosis. A stable, likely benign 14 mm diameter cystic appearing structure is seen along the posterior aspect of the right iliac vessels (axial CT image 49, CT series 3). No enlarged abdominal or pelvic lymph nodes. Reproductive: Status post hysterectomy. The bilateral adnexa  are unremarkable. Other: No abdominal wall hernia or abnormality. No abdominopelvic ascites. Musculoskeletal: Chronic loss of vertebral body height is seen involving the T11, T12, L1, L2, L3 and L4 vertebral bodies. Findings involving the L3 and L4 vertebral bodies are new when compared to the prior study. IMPRESSION: 1. Findings likely consistent with early appendicitis. 2. 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 Correlation with nonemergent hepatic MRI is recommended. 3. 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 4. Multiple chronic compression fracture deformities within the lower thoracic spine and lumbar spine, as described above. 5. Aortic atherosclerosis. Aortic Atherosclerosis (ICD10-I70.0). Electronically Signed   By: Virgina Norfolk M.D.   On: 04/30/2022 03:31   US Abdomen Limited RUQ (LIVER/GB)  Result Date: 04/30/2022 CLINICAL DATA:  Right upper quadrant pain. EXAM: ULTRASOUND ABDOMEN LIMITED RIGHT UPPER QUADRANT COMPARISON:  04/15/2022. FINDINGS: Gallbladder: No gallstones or wall thickening visualized. No sonographic Murphy sign noted by sonographer. Common bile duct: Diameter: 5.9 mm. Liver: No focal lesion identified. Within normal limits in parenchymal echogenicity. Portal vein is patent on color Doppler imaging with normal direction of blood flow towards the liver. Other: No free fluid. IMPRESSION: Normal examination. Electronically Signed   By: Brett Fairy M.D.   On: 04/30/2022 02:07    Review of Systems  Constitutional:  Negative for chills and fever.  HENT:  Negative for ear discharge, hearing loss and sore throat.   Eyes:  Negative for discharge.  Respiratory:  Negative for cough and shortness of breath.   Cardiovascular:  Negative for chest pain and leg swelling.  Gastrointestinal:  Positive for abdominal pain, nausea and vomiting. Negative for constipation and diarrhea.  Musculoskeletal:   Negative for myalgias and neck pain.  Skin:  Negative for rash.  Allergic/Immunologic: Negative for environmental allergies.  Neurological:  Negative for dizziness and seizures.  Hematological:  Does not bruise/bleed easily.  Psychiatric/Behavioral:  Negative for  suicidal ideas.   All other systems reviewed and are negative.   Blood pressure 117/73, pulse 92, temperature 99.5 F (37.5 C), temperature source Oral, resp. rate 17, height 5' (1.524 m), weight 52.8 kg, SpO2 97 %. Physical Exam Constitutional:      Appearance: She is well-developed.     Comments: Conversant No acute distress  HENT:     Head: Normocephalic and atraumatic.  Eyes:     General: Lids are normal. No scleral icterus.    Pupils: Pupils are equal, round, and reactive to light.     Comments: Pupils are equal round and reactive No lid lag Moist conjunctiva  Neck:     Thyroid: No thyromegaly.     Trachea: No tracheal tenderness.     Comments: No cervical lymphadenopathy Cardiovascular:     Rate and Rhythm: Normal rate and regular rhythm.     Heart sounds: No murmur heard. Pulmonary:     Effort: Pulmonary effort is normal.     Breath sounds: Normal breath sounds. No wheezing or rales.  Abdominal:     Tenderness: There is abdominal tenderness. Positive signs include McBurney's sign.     Hernia: No hernia is present.  Musculoskeletal:     Cervical back: Normal range of motion and neck supple.  Skin:    General: Skin is warm.     Findings: No rash.     Nails: There is no clubbing.     Comments: Normal skin turgor  Neurological:     Mental Status: She is alert and oriented to person, place, and time.     Comments: Normal gait and station  Psychiatric:        Mood and Affect: Mood normal.        Thought Content: Thought content normal.        Judgment: Judgment normal.     Comments: Appropriate affect      Assessment/Plan 71F with acute appendicitis To OR for lap appy..  I discussed with the  patient the risks benefits of the procedure to include but not limited to: Infection, bleeding, damage to surrounding structures, possible ileus, possible postoperative infection. Patient voiced understanding and wishes to proceed.   Ralene Ok, MD 04/30/2022, 6:47 AM

## 2022-04-30 NOTE — Discharge Instructions (Addendum)
POST OP INSTRUCTIONS  DIET: As tolerated. Follow a light bland diet the first 24 hours after arrival home, such as soup, liquids, crackers, etc.  Be sure to include lots of fluids daily.  Avoid fast food or heavy meals as your are more likely to get nauseated.  Eat a low fat the next few days after surgery.  Take your usually prescribed home medications unless otherwise directed.  PAIN CONTROL: Pain is best controlled by a usual combination of three different methods TOGETHER: Ice/Heat Over the counter pain medication Prescription pain medication Most patients will experience some swelling and bruising around the surgical site.  Ice packs or heating pads (30-60 minutes up to 6 times a day) will help. Some people prefer to use ice alone, heat alone, alternating between ice & heat.  Experiment to what works for you.  Swelling and bruising can take several weeks to resolve.   It is helpful to take an over-the-counter pain medication regularly for the first few weeks: Ibuprofen (Motrin/Advil) - '200mg'$  tabs - take 3 tabs ('600mg'$ ) every 6 hours as needed for pain Acetaminophen (Tylenol) - you may take '650mg'$  every 6 hours as needed. You can take this with motrin as they act differently on the body. If you are taking a narcotic pain medication that has acetaminophen in it, do not take over the counter tylenol at the same time.  Iii. NOTE: You may take both of these medications together - most patients  find it most helpful when alternating between the two (i.e. Ibuprofen at 6am,  tylenol at 9am, ibuprofen at 12pm ..Marland Kitchen) A  prescription for pain medication should be given to you upon discharge.  Take your pain medication as prescribed if your pain is not adequatly controlled with the over-the-counter pain reliefs mentioned above.  Avoid getting constipated.  Between the surgery and the pain medications, it is common to experience some constipation.  Increasing fluid intake and taking a fiber supplement (such as  Metamucil, Citrucel, FiberCon, MiraLax, etc) 1-2 times a day regularly will usually help prevent this problem from occurring.  A mild laxative (prune juice, Milk of Magnesia, MiraLax, etc) should be taken according to package directions if there are no bowel movements after 48 hours.    Dressing: Your incisions are covered in Dermabond which is like sterile superglue for the skin. This will come off on it's own in a couple weeks. It is waterproof and you may bathe normally starting the day after your surgery in a shower. Avoid baths/pools/lakes/oceans until your wounds have fully healed.  ACTIVITIES as tolerated:   Avoid heavy lifting (>10lbs or 1 gallon of milk) for the next 6 weeks. You may resume regular (light) daily activities beginning the next day--such as daily self-care, walking, climbing stairs--gradually increasing activities as tolerated.  If you can walk 30 minutes without difficulty, it is safe to try more intense activity such as jogging, treadmill, bicycling, low-impact aerobics.  DO NOT PUSH THROUGH PAIN.  Let pain be your guide: If it hurts to do something, don't do it. You may drive when you are no longer taking prescription pain medication, you can comfortably wear a seatbelt, and you can safely maneuver your car and apply brakes.   FOLLOW UP in our office Please call [facility] at [phone] to set up an appointment to see your surgeon in the office for a follow-up appointment approximately 2 weeks after your surgery. Make sure that you call for this appointment the day you arrive home to  insure a convenient appointment time.  9. If you have disability or family leave forms that need to be completed, you may have them completed by your primary care physician's office; for return to work instructions, please ask our office staff and they will be happy to assist you in obtaining this documentation   When to call us [phone]: Poor pain control Reactions / problems with new  medications (rash/itching, etc)  Fever over 101.5 F (38.5 C) Inability to urinate Nausea/vomiting Worsening swelling or bruising Continued bleeding from incision. Increased pain, redness, or drainage from the incision  The clinic staff is available to answer your questions during regular business hours (8:30am-5pm).  Please don't hesitate to call and ask to speak to one of our nurses for clinical concerns.   A surgeon from [facility] is always on call at the hospitals   If you have a medical emergency, go to the nearest emergency room or call 911.  [facility] [address], [place], [place], [place]  [address] MAIN: [phone] FAX: [phone] [email or web address]

## 2022-04-30 NOTE — Transfer of Care (Signed)
Immediate Anesthesia Transfer of Care Note  Patient: Alexis Barajas  Procedure(s) Performed: APPENDECTOMY LAPAROSCOPIC (Abdomen)  Patient Location: PACU  Anesthesia Type:General  Level of Consciousness: awake, alert , and oriented  Airway & Oxygen Therapy: Patient Spontanous Breathing and Patient connected to nasal cannula oxygen  Post-op Assessment: Report given to RN, Post -op Vital signs reviewed and stable, and Patient moving all extremities X 4  Post vital signs: Reviewed and stable  Last Vitals:  Vitals Value Taken Time  BP 107/86 04/30/22 0930  Temp    Pulse 89 04/30/22 0932  Resp 20 04/30/22 0932  SpO2 94 % 04/30/22 0932  Vitals shown include unvalidated device data.  Last Pain:  Vitals:   04/30/22 0731  TempSrc: Oral  PainSc: 9          Complications: No notable events documented.

## 2022-04-30 NOTE — Discharge Summary (Signed)
Patient ID: CHARISE KOEGEL MRN: [id number] DOB/AGE: 69-Oct-1957 69 y.o.  Admit date: 04/29/2022 Discharge date: 04/30/2022  Discharge Diagnoses Patient Active Problem List   Diagnosis Date Noted   Appendicitis 04/30/2022   Postoperative abdominal pain 05/17/2014   Pelvic prolapse 05/16/2014    Consultants none  Procedures Laparoscopic appendectomy 04/30/22  Hospital Course: She was taken directly from ER to operating room for surgery and was not physically admitted to the hospital per se. Case was uneventful and she was found to have non-perforated appendicitis. She recovered postoperatively well and was discharged home from the recovery room after undergoing discharge instructions and planning. Follow-up in the office being arranged    Allergies as of 04/30/2022       Reactions   Tramadol Itching   Oxycodone Rash   "Patient is allergic to narcotic pain medication"   Penicillins Rash   Has patient had a PCN reaction causing immediate rash, facial/tongue/throat swelling, SOB or lightheadedness with hypotension: Yes Has patient had a PCN reaction causing severe rash involving mucus membranes or skin necrosis: No Has patient had a PCN reaction that required hospitalization: No Has patient had a PCN reaction occurring within the last 10 years: No If all of the above answers are "NO", then may proceed with Cephalosporin use.        Medication List     STOP taking these medications    VITAMIN D3 PO   VITAMIN K PO       TAKE these medications    Anoro Ellipta 62.5-25 MCG/ACT Aepb Generic drug: umeclidinium-vilanterol Inhale 1 puff by mouth once daily   buPROPion 150 MG 24 hr tablet Commonly known as: WELLBUTRIN XL Take 1 mg by mouth daily.   calcium carbonate 600 MG Tabs tablet Commonly known as: OS-CAL Take 600 mg by mouth daily.   metoprolol succinate 25 MG 24 hr tablet Commonly known as: TOPROL-XL Take 1 tablet (25 mg total) by mouth daily. Per Dr. Angelena Form OK  to fill, pt not due to be seen again until 2025.   oxyCODONE-acetaminophen 5-325 MG tablet Commonly known as: PERCOCET/ROXICET Take 2 tablets by mouth every 6 (six) hours as needed.   Ventolin HFA 108 (90 Base) MCG/ACT inhaler Generic drug: albuterol          Follow-up Information     Maczis, Carlena Hurl, PA-C Follow up on 05/20/2022.   Specialty: General Surgery Why: 11:15 am, Arrive 30 minutes prior to your appointment time, Please bring your insurance card and photo ID Contact information: [place] [address] [address] M. Dema Severin, M.D. [facility]

## 2022-04-30 NOTE — Anesthesia Postprocedure Evaluation (Signed)
Anesthesia Post Note  Patient: Alexis Barajas  Procedure(s) Performed: APPENDECTOMY LAPAROSCOPIC (Abdomen)     Patient location during evaluation: PACU Anesthesia Type: General Level of consciousness: awake and alert Pain management: pain level controlled Vital Signs Assessment: post-procedure vital signs reviewed and stable Respiratory status: spontaneous breathing, nonlabored ventilation, respiratory function stable and patient connected to nasal cannula oxygen Cardiovascular status: blood pressure returned to baseline and stable Postop Assessment: no apparent nausea or vomiting Anesthetic complications: no   No notable events documented.  Last Vitals:  Vitals:   04/30/22 1015 04/30/22 1030  BP: 113/65   Pulse: 88   Resp: 15   Temp:  37.2 C  SpO2: 94%     Last Pain:  Vitals:   04/30/22 1030  TempSrc:   PainSc: Asleep                 Belenda Cruise P Fynn Adel

## 2022-05-01 ENCOUNTER — Encounter (HOSPITAL_COMMUNITY): Payer: Self-pay | Admitting: Surgery

## 2022-05-01 LAB — SURGICAL PATHOLOGY

## 2022-08-06 ENCOUNTER — Ambulatory Visit
Admission: RE | Admit: 2022-08-06 | Discharge: 2022-08-06 | Disposition: A | Discharge status: Home / Self Care | Payer: Medicare Other | Source: Ambulatory Visit | Attending: Obstetrics and Gynecology | Admitting: Obstetrics and Gynecology

## 2022-08-06 DIAGNOSIS — Z13820 Encounter for screening for osteoporosis: Secondary | ICD-10-CM

## 2023-02-12 ENCOUNTER — Other Ambulatory Visit: Payer: Self-pay | Admitting: Cardiovascular Disease

## 2023-05-09 ENCOUNTER — Other Ambulatory Visit: Payer: Self-pay | Admitting: Cardiovascular Disease

## 2023-06-29 ENCOUNTER — Ambulatory Visit: Payer: Medicare Other | Attending: Cardiovascular Disease | Admitting: Cardiovascular Disease

## 2023-06-29 ENCOUNTER — Encounter: Payer: Self-pay | Admitting: Cardiovascular Disease

## 2023-06-29 VITALS — BP 130/80 | HR 100 | Ht 60.0 in | Wt 118.8 lb

## 2023-06-29 DIAGNOSIS — Z09 Encounter for follow-up examination after completed treatment for conditions other than malignant neoplasm: Secondary | ICD-10-CM | POA: Diagnosis not present

## 2023-06-29 DIAGNOSIS — I4729 Other ventricular tachycardia: Secondary | ICD-10-CM | POA: Diagnosis not present

## 2023-06-29 NOTE — Progress Notes (Signed)
 Chief Complaint  Patient presents with   Follow-up    VT   History of Present Illness: 68 yo female with history of non-sustained VT, anxiety, depression and arthritis here today for cardiac follow up. I saw her as a new patient in February 2020 for the evaluation of palpitations. She wore a cardiac monitor per Dr. Emmy Harper in November 2019 and this showed sinus rhythm with one 5 beat run of non-sustained VT.  I started Toprol  and arranged an echo and a stress test. Echo 04/21/18 with LVEF=55-60%. No significant valve disease. Exercise stress test February 2020 with no ischemia.   He is here today for follow up. The patient denies any chest pain, dyspnea, palpitations, lower extremity edema, orthopnea, PND, dizziness, near syncope or syncope.   Primary Care Physician: Candiss Chamorro, MD  Past Medical History:  Diagnosis Date   Anxiety    no meds   Arthritis    hands, lower back - no meds   Depression    no meds   NSVT (nonsustained ventricular tachycardia) (HCC)    Osteoporosis    SVD (spontaneous vaginal delivery)    x 3   Past Surgical History:  Procedure Laterality Date   ABDOMINAL HYSTERECTOMY     ANTERIOR AND POSTERIOR REPAIR N/A 05/16/2014   Procedure: ANTERIOR (CYSTOCELE) AND POSTERIOR REPAIR (RECTOCELE);  Surgeon: Lula Sale, MD;  Location: [facility];  Service: Gynecology;  Laterality: N/A;   BURCH PROCEDURE N/A 05/16/2014   Procedure: BURCH PROCEDURE;  Surgeon: Lula Sale, MD;  Location: [facility];  Service: Gynecology;  Laterality: N/A;   EAR CYST EXCISION N/A 05/16/2014   Procedure: INCLUSION CYST REMOVAL;  Surgeon: Lula Sale, MD;  Location: [facility];  Service: Gynecology;  Laterality: N/A;   LAPAROSCOPIC APPENDECTOMY N/A 04/30/2022   Procedure: APPENDECTOMY LAPAROSCOPIC;  Surgeon: Melvenia Stabs, MD;  Location: [facility];  Service: General;  Laterality: N/A;   LEG SURGERY     metal plate with screws   right leg surgery     TUBAL LIGATION     VAGINAL  HYSTERECTOMY N/A 05/16/2014   Procedure: TOTAL VAGINAL HYSTECTOMY ;  Surgeon: Lula Sale, MD;  Location: [facility];  Service: Gynecology;  Laterality: N/A;   WISDOM TOOTH EXTRACTION      Current Outpatient Medications  Medication Sig Dispense Refill   ALPRAZolam (XANAX) 0.5 MG tablet Take 0.5 mg by mouth at bedtime as needed.     ANORO ELLIPTA  62.5-25 MCG/INH AEPB Inhale 1 puff by mouth once daily 60 each 0   Bismuth/Metronidaz/Tetracyclin (PYLERA) 140-125-125 MG CAPS      buPROPion (WELLBUTRIN XL) 150 MG 24 hr tablet Take 1 mg by mouth daily.     calcium carbonate (OS-CAL) 600 MG TABS tablet Take 600 mg by mouth daily.     cyclobenzaprine (FLEXERIL) 10 MG tablet Take 10 mg by mouth every 8 (eight) hours as needed.     HYDROmorphone  (DILAUDID ) 2 MG tablet Take 2 mg by mouth.     meclizine (ANTIVERT) 25 MG tablet Take 25 mg by mouth 3 (three) times daily as needed.     meloxicam (MOBIC) 7.5 MG tablet Take 7.5 mg by mouth.     metoprolol  succinate (TOPROL -XL) 25 MG 24 hr tablet Take 1 tablet by mouth once daily 30 tablet 1   pantoprazole (PROTONIX) 20 MG tablet Take 20 mg by mouth daily.     VENTOLIN HFA 108 (90 Base) MCG/ACT inhaler      No current facility-administered medications  for this visit.    Allergies  Allergen Reactions   Tramadol Itching   Oxycodone  Rash    "Patient is allergic to narcotic pain medication"   Penicillins Rash    Has patient had a PCN reaction causing immediate rash, facial/tongue/throat swelling, SOB or lightheadedness with hypotension: Yes Has patient had a PCN reaction causing severe rash involving mucus membranes or skin necrosis: No Has patient had a PCN reaction that required hospitalization: No Has patient had a PCN reaction occurring within the last 10 years: No If all of the above answers are "NO", then may proceed with Cephalosporin use.     Social History   Socioeconomic History   Marital status: Married    Spouse name: Not on file    Number of children: 3   Years of education: Not on file   Highest education level: Not on file  Occupational History   Occupation: Retired  Tobacco Use   Smoking status: Former    Current packs/day: 0.00    Average packs/day: 1 pack/day for 18.0 years (18.0 ttl pk-yrs)    Types: Cigarettes    Start date: 44    Quit date: 1990    Years since quitting: 35.3   Smokeless tobacco: Never  Vaping Use   Vaping status: Never Used  Substance and Sexual Activity   Alcohol use: No   Drug use: No   Sexual activity: Not on file  Other Topics Concern   Not on file  Social History Narrative   Not on file   Social Drivers of Health   Financial Resource Strain: Not on file  Food Insecurity: Not on file  Transportation Needs: Not on file  Physical Activity: Not on file  Stress: Not on file  Social Connections: Unknown (07/15/2021)   Received from [facility], [facility]   Social Network    Social Network: Not on file  Intimate Partner Violence: Unknown (06/06/2021)   Received from [facility], [facility]   HITS    Physically Hurt: Not on file    Insult or Talk Down To: Not on file    Threaten Physical Harm: Not on file    Scream or Curse: Not on file    Family History  Problem Relation Age of Onset   COPD Mother    Brain cancer Father    Heart attack Maternal Grandmother    COPD Brother     Review of Systems:  As stated in the HPI and otherwise negative.   BP 130/80   Pulse 100   Ht 5' (1.524 m)   Wt 53.9 kg   SpO2 97%   BMI 23.20 kg/m   Physical Examination: General: Well developed, well nourished, NAD  HEENT: OP clear, mucus membranes moist  SKIN: warm, dry. No rashes. Neuro: No focal deficits  Musculoskeletal: Muscle strength 5/5 all ext  Psychiatric: Mood and affect normal  Neck: No JVD, no carotid bruits, no thyromegaly, no lymphadenopathy.  Lungs:Clear bilaterally, no wheezes, rhonci, crackles Cardiovascular: Regular rate and rhythm. No murmurs,  gallops or rubs. Abdomen:Soft. Bowel sounds present. Non-tender.  Extremities: No lower extremity edema. Pulses are 2 + in the bilateral DP/PT.  EKG:  EKG is ordered today. The ekg ordered today demonstrates  EKG Interpretation Date/Time:  Monday June 29 2023 09:28:00 EDT Ventricular Rate:  96 PR Interval:  162 QRS Duration:  80 QT Interval:  340 QTC Calculation: 429 R Axis:   23  Text Interpretation: Normal sinus rhythm Poor R wave  progression Confirmed by Antoinette Batman [phone]) on 06/29/2023 9:29:22 AM    Recent Labs: No results found for requested labs within last 365 days.   Lipid Panel No results found for: "CHOL", "TRIG", "HDL", "CHOLHDL", "VLDL", "LDLCALC", "LDLDIRECT"   Wt Readings from Last 3 Encounters:  06/29/23 53.9 kg  04/29/22 52.8 kg  04/26/21 52.8 kg    Assessment and Plan:   1. Non-sustained VT: Normal LV systolic function by echo in 2020. No ischemia on stress test in 2020. No palpitations. Continue Toprol .    Labs/ tests ordered today include:  Orders Placed This Encounter  Procedures   EKG 12-Lead   Disposition:   F/U with me in 12 months  Signed, Antoinette Batman, MD 06/29/2023 9:33 AM    [facility] [address] [place], [place], [place]  [address] Phone: [phone]; Fax: [phone]

## 2023-06-29 NOTE — Patient Instructions (Signed)
 Medication Instructions:  No changes *If you need a refill on your cardiac medications before your next appointment, please call your pharmacy*  Lab Work: none If you have labs (blood work) drawn today and your tests are completely normal, you will receive your results only by: MyChart Message (if you have MyChart) OR A paper copy in the mail If you have any lab test that is abnormal or we need to change your treatment, we will call you to review the results.  Testing/Procedures: none  Follow-Up: At [facility], you and your health needs are our priority.  As part of our continuing mission to provide you with exceptional heart care, our providers are all part of one team.  This team includes your primary Cardiologist (physician) and Advanced Practice Providers or APPs (Physician Assistants and Nurse Practitioners) who all work together to provide you with the care you need, when you need it.  Your next appointment:   12 month(s)  Provider:   Antoinette Batman, MD    We recommend signing up for the patient portal called "MyChart".  Sign up information is provided on this After Visit Summary.  MyChart is used to connect with patients for Virtual Visits (Telemedicine).  Patients are able to view lab/test results, encounter notes, upcoming appointments, etc.  Non-urgent messages can be sent to your provider as well.   To learn more about what you can do with MyChart, go to [email or web address].   Other Instructions

## 2023-07-12 ENCOUNTER — Other Ambulatory Visit: Payer: Self-pay | Admitting: Cardiovascular Disease

## 2023-10-11 ENCOUNTER — Other Ambulatory Visit: Payer: Self-pay | Admitting: Internal Medicine

## 2024-07-01 ENCOUNTER — Ambulatory Visit: Admitting: Cardiovascular Disease
